# Patient Record
Sex: Male | Born: 1985 | Race: White | Hispanic: No | Marital: Married | Smoking: Current some day smoker
Health system: Southern US, Community
[De-identification: ages and names within clinical notes are randomized; demographics above are authoritative.]

## PROBLEM LIST (undated history)

## (undated) DIAGNOSIS — F32A Depression, unspecified: Secondary | ICD-10-CM

## (undated) DIAGNOSIS — F419 Anxiety disorder, unspecified: Secondary | ICD-10-CM

## (undated) DIAGNOSIS — T7840XA Allergy, unspecified, initial encounter: Secondary | ICD-10-CM

## (undated) DIAGNOSIS — M1990 Unspecified osteoarthritis, unspecified site: Secondary | ICD-10-CM

## (undated) HISTORY — DX: Anxiety disorder, unspecified: F41.9

## (undated) HISTORY — DX: Unspecified osteoarthritis, unspecified site: M19.90

## (undated) HISTORY — DX: Depression, unspecified: F32.A

## (undated) HISTORY — DX: Allergy, unspecified, initial encounter: T78.40XA

---

## 2009-04-08 ENCOUNTER — Emergency Department: Payer: Self-pay | Admitting: Emergency Medicine

## 2016-12-25 ENCOUNTER — Encounter: Payer: Self-pay | Admitting: *Deleted

## 2016-12-25 ENCOUNTER — Ambulatory Visit (INDEPENDENT_AMBULATORY_CARE_PROVIDER_SITE_OTHER): Payer: Self-pay

## 2016-12-25 ENCOUNTER — Ambulatory Visit
Admission: EM | Admit: 2016-12-25 | Discharge: 2016-12-25 | Disposition: A | Discharge status: Home / Self Care | Payer: Self-pay | Attending: Emergency Medicine | Admitting: Emergency Medicine

## 2016-12-25 DIAGNOSIS — J069 Acute upper respiratory infection, unspecified: Secondary | ICD-10-CM

## 2016-12-25 DIAGNOSIS — B9789 Other viral agents as the cause of diseases classified elsewhere: Secondary | ICD-10-CM

## 2016-12-25 DIAGNOSIS — R05 Cough: Secondary | ICD-10-CM

## 2016-12-25 MED ORDER — ALBUTEROL SULFATE HFA 108 (90 BASE) MCG/ACT IN AERS: 1.0000 | INHALATION_SPRAY | Freq: Four times a day (QID) | RESPIRATORY_TRACT | 0 refills | Status: DC | PRN

## 2016-12-25 MED ORDER — AEROCHAMBER PLUS MISC: 2 refills | Status: DC

## 2016-12-25 MED ORDER — IPRATROPIUM-ALBUTEROL 0.5-2.5 (3) MG/3ML IN SOLN
3.0000 mL | Freq: Once | RESPIRATORY_TRACT | Status: AC
  Administered 2016-12-25: 3 mL via RESPIRATORY_TRACT

## 2016-12-25 MED ORDER — BENZONATATE 200 MG PO CAPS: 200.0000 mg | ORAL_CAPSULE | Freq: Three times a day (TID) | ORAL | 0 refills | Status: DC | PRN

## 2016-12-25 MED ORDER — FLUTICASONE PROPIONATE 50 MCG/ACT NA SUSP: 2.0000 | Freq: Every day | NASAL | 0 refills | Status: DC

## 2016-12-25 MED ORDER — IBUPROFEN 600 MG PO TABS: 600.0000 mg | ORAL_TABLET | Freq: Four times a day (QID) | ORAL | 0 refills | Status: DC | PRN

## 2016-12-25 MED ORDER — GUAIFENESIN-CODEINE 100-10 MG/5ML PO SYRP: 10.0000 mL | ORAL_SOLUTION | Freq: Four times a day (QID) | ORAL | 0 refills | Status: DC | PRN

## 2016-12-25 NOTE — ED Provider Notes (Signed)
HPI  SUBJECTIVE:  Jay Hill is a 31 y.o. male who presents with nonproductive cough, chest congestion, wheezing, shortness of breath and diffuse constant chest tightness for the past 5 days. States that his chest is sore from all the coughing states he cannot sleep secondary to the cough.. States all this started off as a URI with nasal congestion, rhinorrhea, postnasal drip, sinus pain and pressure, sore throat for a day, which then resolved. He denies current sinus pain or pressure, nasal congestion, rhinorrhea. No body aches, headaches, ear pain. No fevers, posttussive emesis. He reports intermittent GERD like symptoms, but states that it does not seem to be associated with chest tightness or cough. He also reports allergy type symptoms with itchy eyes. He denies sneezing. He works outside and has a history of seasonal allergies. He also has a history of GERD, migraines. He tried TheraFlu, OTC sinus medicines, Mucinex, ibuprofen, humidifier and steam. The humidifier and steam seemed to help his symptoms. Symptoms are worse with lying down at night. He denies any antibiotics in the past month, no antipyretic in the past 6-8 hours. No chest tightness worse with exertion, diaphoresis, radiation to his neck, back, arm. No unintentional weight gain, lower extremity edema, dyspnea on exertion, nocturia, orthopnea, abdominal pain. He has no past medical history of lung disease, he smoked for 6 months as a teen, quit years ago. No history of hypertension, diabetes, MI, hypercholesterolemia, CHF. PMD: None.   History reviewed. No pertinent past medical history.  History reviewed. No pertinent surgical history.  History reviewed. No pertinent family history.  Social History  Substance Use Topics  . Smoking status: Former Games developer  . Smokeless tobacco: Never Used  . Alcohol use Yes    No current facility-administered medications for this encounter.   Current Outpatient Prescriptions:  .   albuterol (PROVENTIL HFA;VENTOLIN HFA) 108 (90 Base) MCG/ACT inhaler, Inhale 1-2 puffs into the lungs every 6 (six) hours as needed for wheezing or shortness of breath., Disp: 1 Inhaler, Rfl: 0 .  benzonatate (TESSALON) 200 MG capsule, Take 1 capsule (200 mg total) by mouth 3 (three) times daily as needed for cough., Disp: 30 capsule, Rfl: 0 .  fluticasone (FLONASE) 50 MCG/ACT nasal spray, Place 2 sprays into both nostrils daily., Disp: 16 g, Rfl: 0 .  guaiFENesin-codeine (CHERATUSSIN AC) 100-10 MG/5ML syrup, Take 10 mLs by mouth 4 (four) times daily as needed for cough., Disp: 120 mL, Rfl: 0 .  ibuprofen (ADVIL,MOTRIN) 600 MG tablet, Take 1 tablet (600 mg total) by mouth every 6 (six) hours as needed., Disp: 30 tablet, Rfl: 0 .  Spacer/Aero-Holding Chambers (AEROCHAMBER PLUS) inhaler, Use as instructed, Disp: 1 each, Rfl: 2  No Known Allergies   ROS  As noted in HPI.   Physical Exam  BP (!) 131/93 (BP Location: Left Arm)   Pulse 82   Temp 98.3 F (36.8 C) (Oral)   Resp 16   Ht 6\' 1"  (1.854 m)   Wt 210 lb (95.3 kg)   SpO2 98%   BMI 27.71 kg/m   Constitutional: Well developed, well nourished, no acute distress Eyes: PERRL, EOMI, conjunctiva normal bilaterally HENT: Normocephalic, atraumatic,mucus membranes moist. No sinus tenderness. Mild clear nasal congestion, normal oropharynx with cobblestoning, no postnasal drip. Respiratory: Occasional expiratory wheeze, positive diffuse chest wall tenderness no rales, rhonchi Cardiovascular: Normal rate and rhythm, no murmurs, no gallops, no rubs GI: Soft, nondistended, normal bowel sounds, nontender, no rebound, no guarding Back: no CVAT skin: No rash, skin intact  Musculoskeletal: Calves symmetric, nontender, No edema,, no deformities Neurologic: Alert & oriented x 3, CN II-XII grossly intact, no motor deficits, sensation grossly intact Psychiatric: Speech and behavior appropriate   ED Course   Medications  ipratropium-albuterol  (DUONEB) 0.5-2.5 (3) MG/3ML nebulizer solution 3 mL (3 mLs Nebulization Given 12/25/16 1123)    Orders Placed This Encounter  Procedures  . DG Chest 2 View    Standing Status:   Standing    Number of Occurrences:   1    Order Specific Question:   Reason for Exam (SYMPTOM  OR DIAGNOSIS REQUIRED)    Answer:   r/o PNA   No results found for this or any previous visit (from the past 24 hour(s)). Dg Chest 2 View  Result Date: 12/25/2016 CLINICAL DATA:  Nonproductive cough, fever, congestion EXAM: CHEST  2 VIEW COMPARISON:  None FINDINGS: Heart and mediastinal contours are within normal limits. No focal opacities or effusions. No acute bony abnormality. IMPRESSION: No active cardiopulmonary disease. Electronically Signed   By: Charlett Nose M.D.   On: 12/25/2016 12:01    ED Clinical Impression  Viral URI with cough   ED Assessment/Plan  Presentation most consistent with URI with cough. He may have some reactive airways worsening his cough. Doubt cardiac cause of his symptoms. Giving DuoNeb, we'll check a chest x-ray to rule out pneumonia. If negative, plan sent home with Claritin, Allegra Zyrtec D, Cheratussin, Tessalon, Flonase, ibuprofen 600 mg 1 g of Tylenol, and albuterol inhaler with a spacer 2 puffs every 4-6 hours as needed for coughing, wheezing, chest tightness and a primary care referral for him to follow up with in several days. He will return here or see his primary care physician if not better after 10 days of being sick and then we can reevaluate the need for antibiotics at that time.   imaging independently reviewed. No pneumonia. See radiology report for details  Reevaluation, patient states that he feels better. Lungs clear on exam. Good air movement.  Plan as above.  Discussed imaging, MDM, plan and followup with patient. Discussed sn/sx that should prompt return to the ED. Patient agrees with plan.   Meds ordered this encounter  Medications  . ipratropium-albuterol  (DUONEB) 0.5-2.5 (3) MG/3ML nebulizer solution 3 mL  . guaiFENesin-codeine (CHERATUSSIN AC) 100-10 MG/5ML syrup    Sig: Take 10 mLs by mouth 4 (four) times daily as needed for cough.    Dispense:  120 mL    Refill:  0  . benzonatate (TESSALON) 200 MG capsule    Sig: Take 1 capsule (200 mg total) by mouth 3 (three) times daily as needed for cough.    Dispense:  30 capsule    Refill:  0  . ibuprofen (ADVIL,MOTRIN) 600 MG tablet    Sig: Take 1 tablet (600 mg total) by mouth every 6 (six) hours as needed.    Dispense:  30 tablet    Refill:  0  . fluticasone (FLONASE) 50 MCG/ACT nasal spray    Sig: Place 2 sprays into both nostrils daily.    Dispense:  16 g    Refill:  0  . albuterol (PROVENTIL HFA;VENTOLIN HFA) 108 (90 Base) MCG/ACT inhaler    Sig: Inhale 1-2 puffs into the lungs every 6 (six) hours as needed for wheezing or shortness of breath.    Dispense:  1 Inhaler    Refill:  0  . Spacer/Aero-Holding Chambers (AEROCHAMBER PLUS) inhaler    Sig: Use as instructed  Dispense:  1 each    Refill:  2    *This clinic note was created using Scientist, clinical (histocompatibility and immunogenetics)Dragon dictation software. Therefore, there may be occasional mistakes despite careful proofreading.  ?   Domenick GongMortenson, Gem Conkle, MD 12/25/16 1221

## 2016-12-25 NOTE — Discharge Instructions (Signed)
Start Claritin d , Allegra  d or Zyrtec D,  Tessalon during the day. Cheratussin to help sleep at night., Flonase, ibuprofen 600 mg 1 g of Tylenol, and albuterol inhaler with a spacer 2 puffs every 4-6 hours as needed for coughing, wheezing, chest tightness. primary care referral for him to follow up with in several days. return here or see his primary care physician if not better after 10 days of being sick and then we can reevaluate the need for antibiotics at that time.    Here is a list of primary care providers who are taking new patients:  Dr. Elizabeth Sauereanna Jones, Dr. Schuyler AmorWilliam Plonk [address] [place] [id number] [phone]  Dr. Everlene OtherJayce Cook [address]  [address]  [place] [id number]  [phone]  [facility] [place] [address] [place] [address]  [place] [address]  [phone]  [facility] [address]  [place], [place] [phone]  [facility] [place] [address]  [phone] [place] [address]

## 2016-12-25 NOTE — ED Triage Notes (Signed)
Fever and sore throat a week ago which resolved but then non-productive cough, chest soreness, runny nose and fatigue.

## 2018-08-05 IMAGING — CR DG CHEST 2V
2 series · 3 of 3 positions shown · non-contrast
Comparison: None

CLINICAL DATA: Nonproductive cough, fever, congestion

EXAM:
CHEST  2 VIEW

[Series 1: chest pa · 0.14mm/px · 2 of 2 slices shown]
[im 1/2]
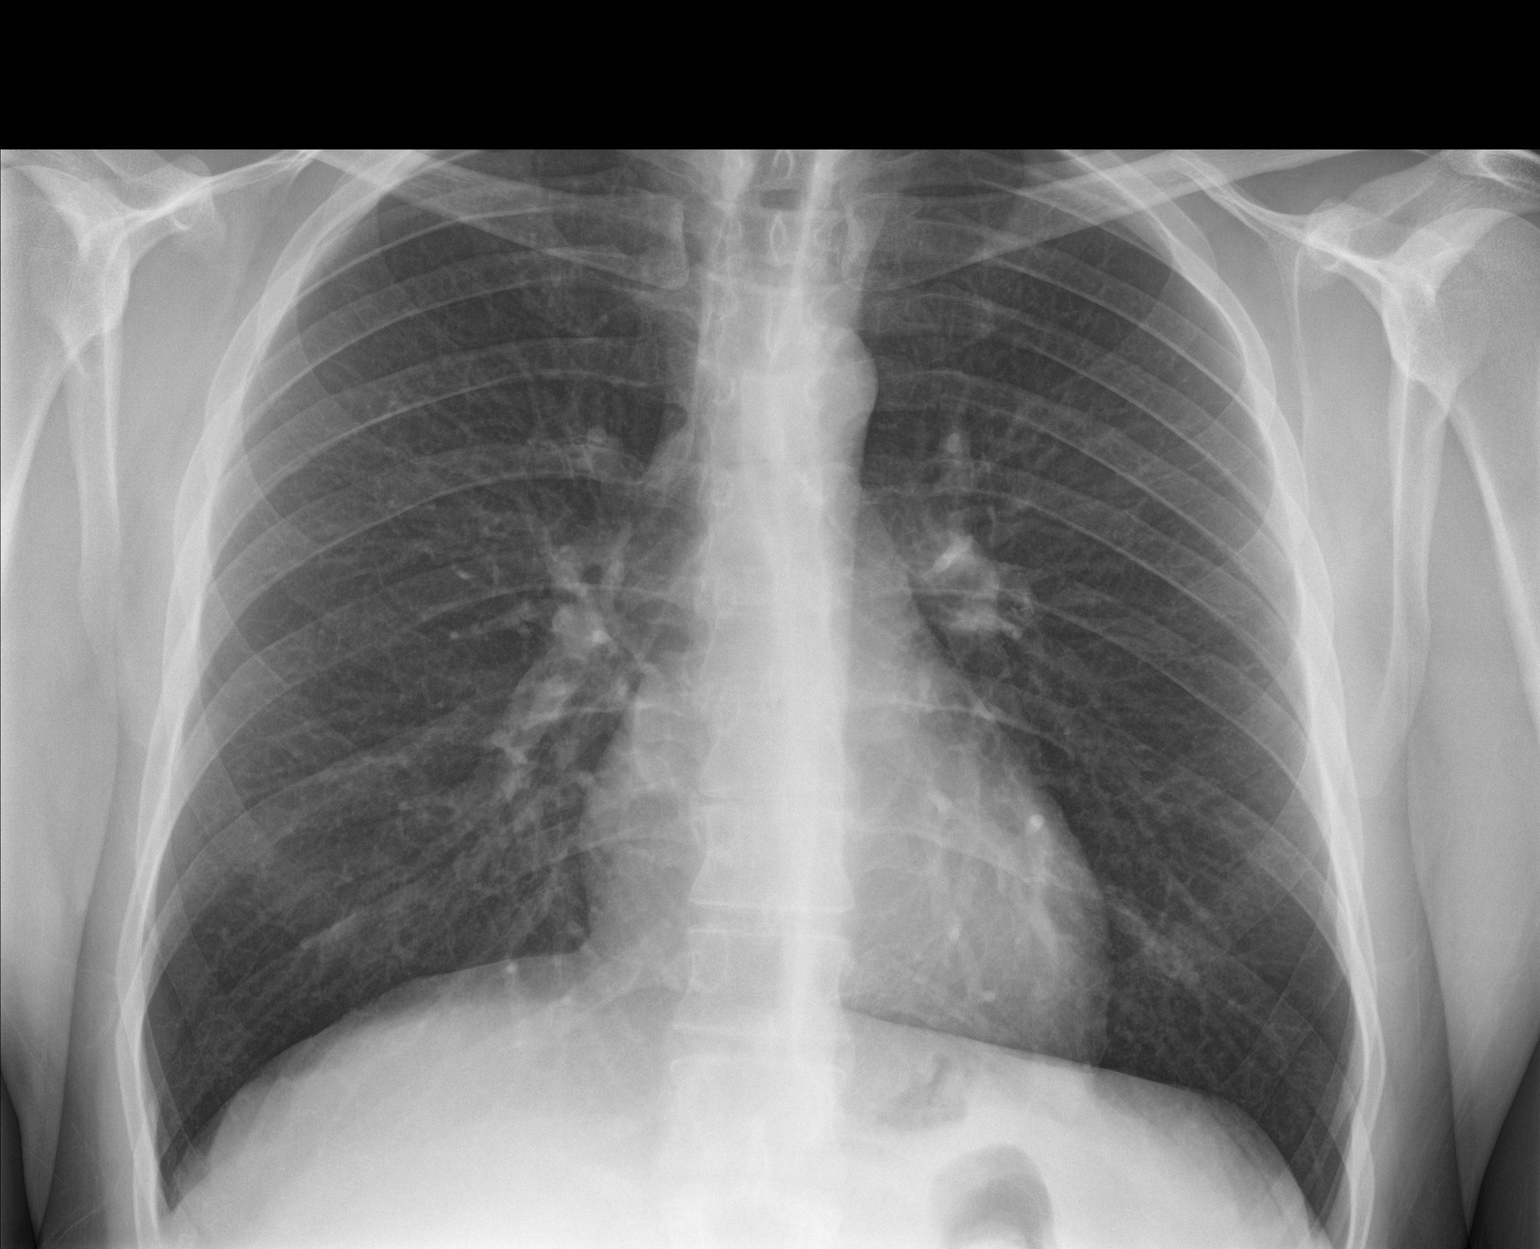
[im 2/2]
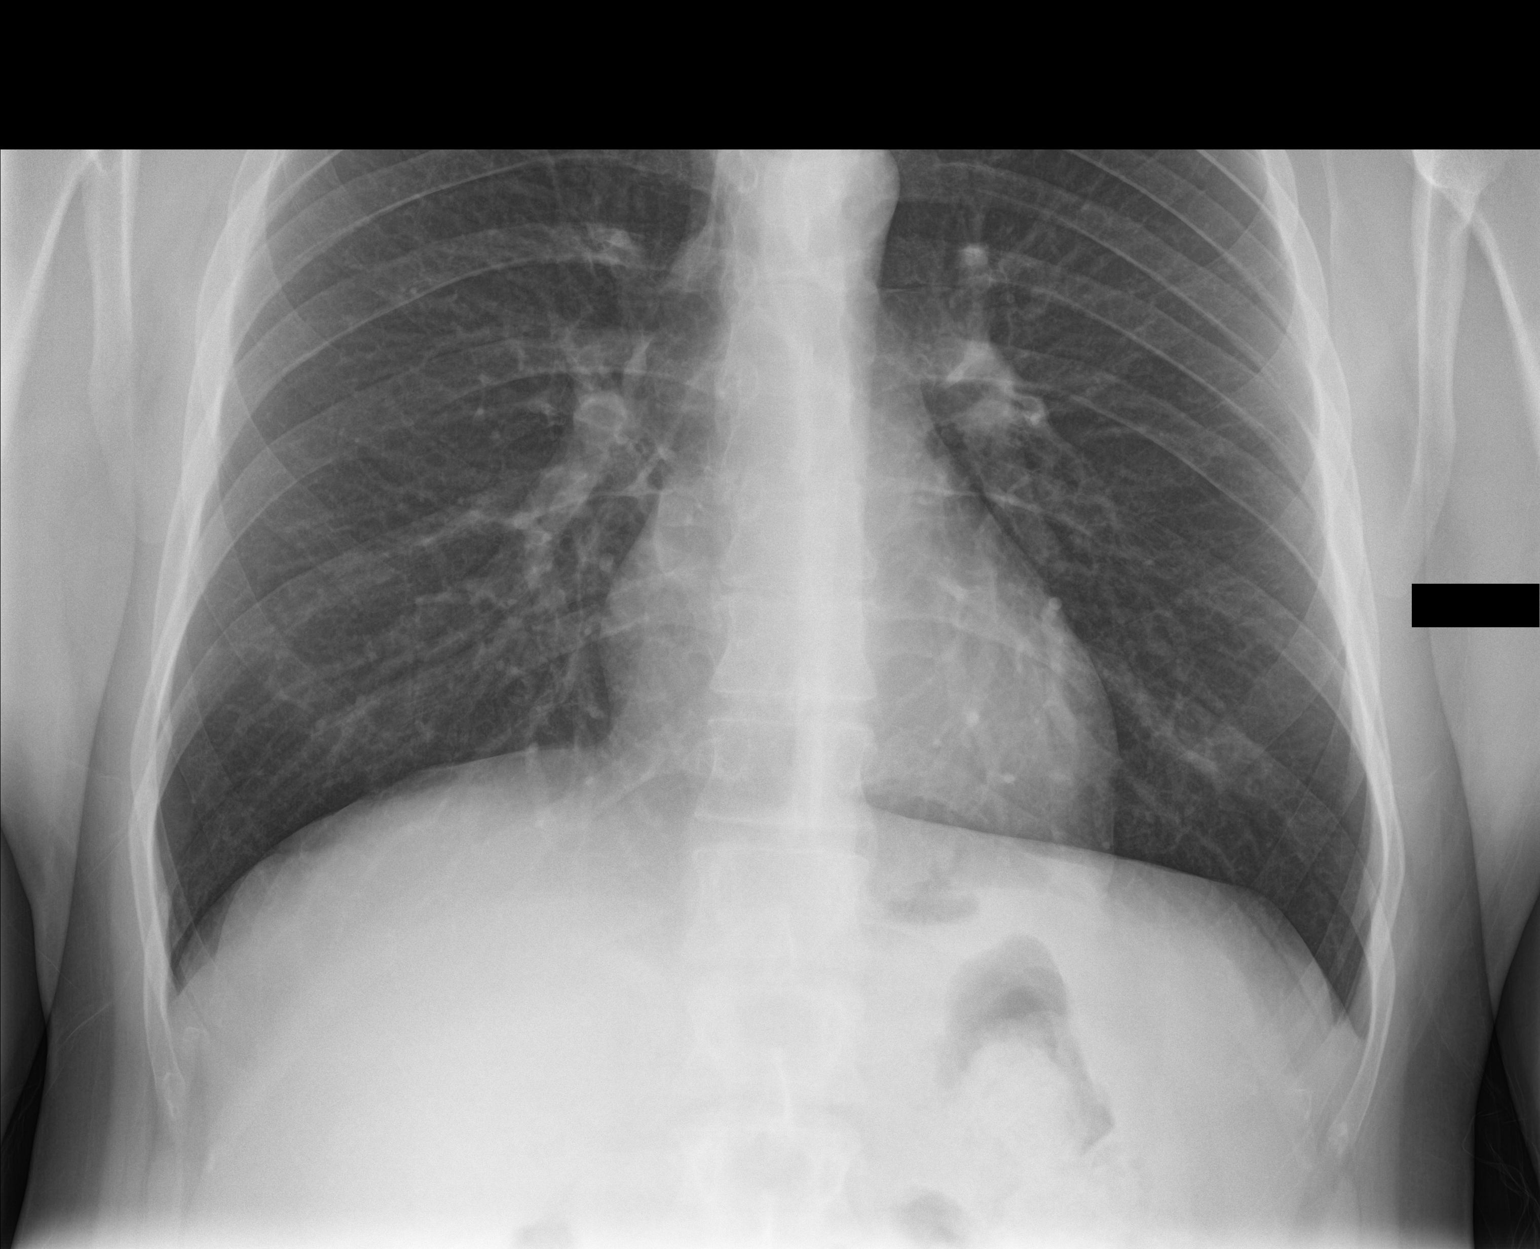

[chest lat]
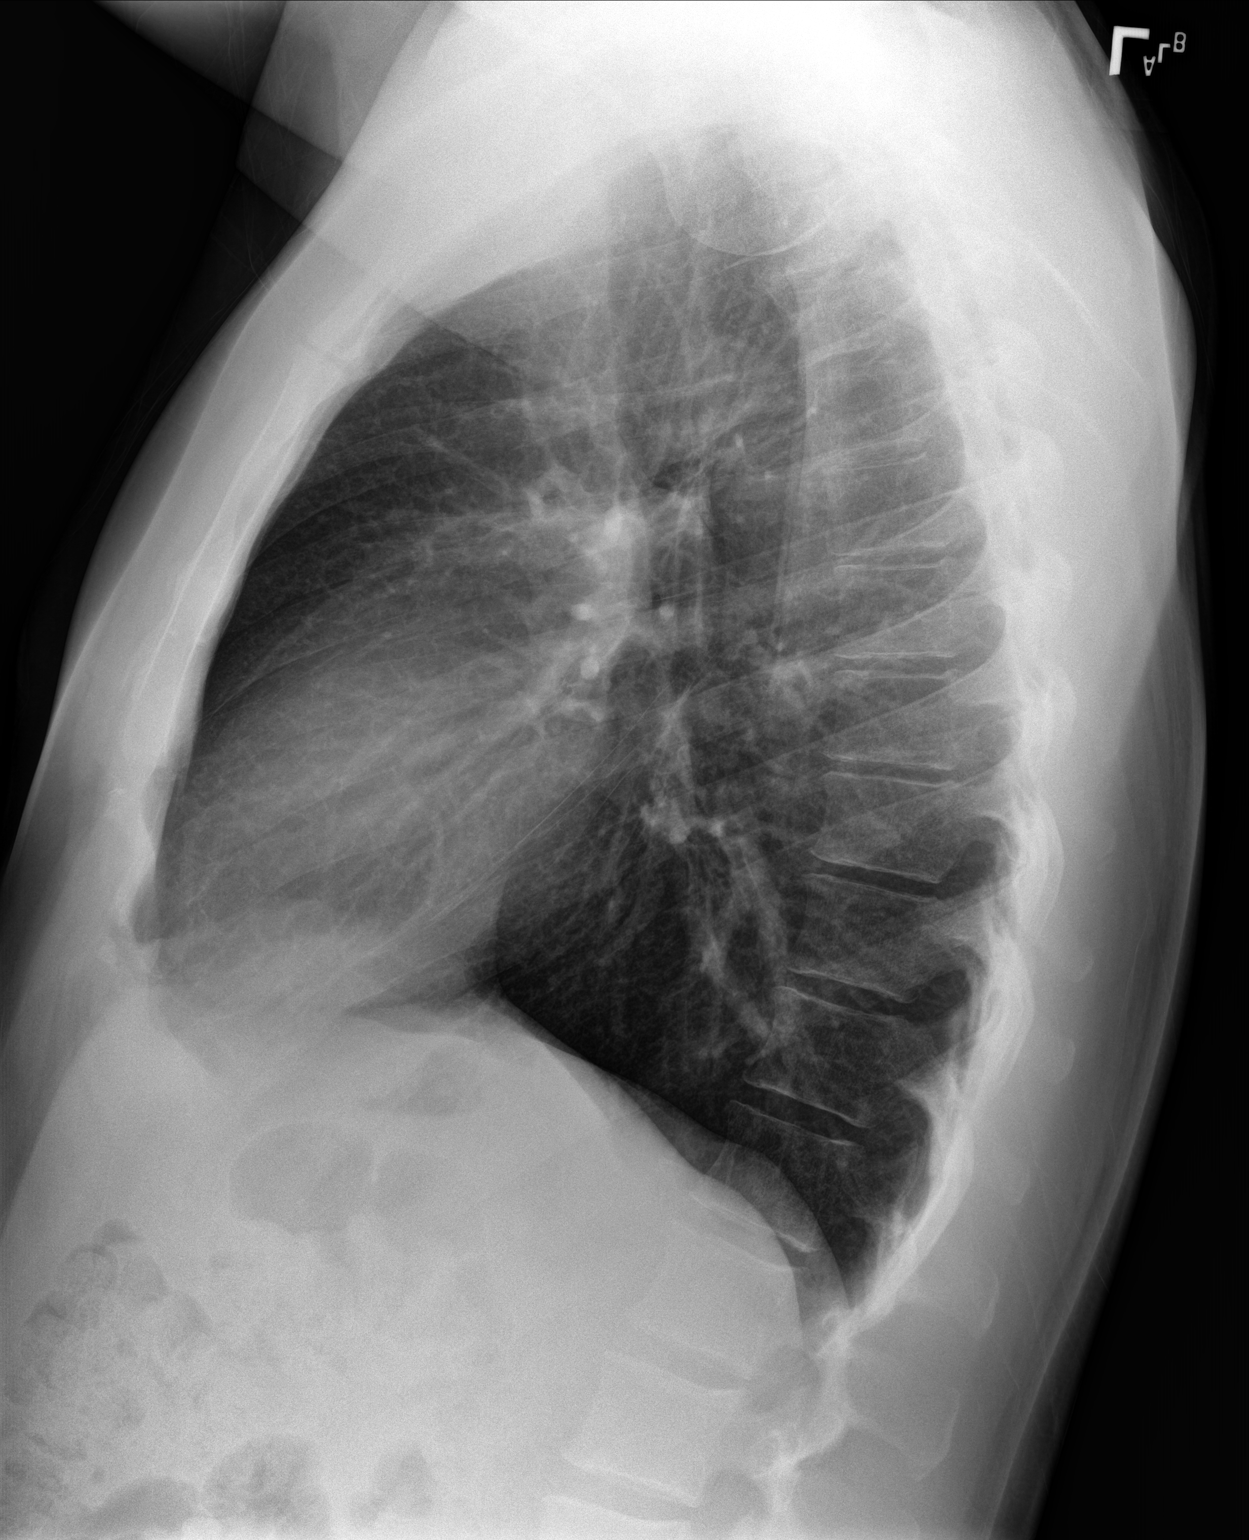

[3 of 3 positions shown; findings below may reference images not displayed]

FINDINGS: Heart and mediastinal contours are within normal limits. No focal
opacities or effusions. No acute bony abnormality.
IMPRESSION: No active cardiopulmonary disease.

## 2020-06-22 ENCOUNTER — Ambulatory Visit (LOCAL_COMMUNITY_HEALTH_CENTER): Payer: Self-pay

## 2020-06-22 ENCOUNTER — Other Ambulatory Visit: Payer: Self-pay

## 2020-06-22 DIAGNOSIS — Z23 Encounter for immunization: Secondary | ICD-10-CM

## 2020-06-22 NOTE — Progress Notes (Signed)
Tolerated Tdap well. Updated [facility] copy given and recommended schedule explained. Jerel Shepherd, RN

## 2020-11-08 ENCOUNTER — Ambulatory Visit: Payer: Self-pay | Admitting: Nurse Practitioner

## 2020-11-15 ENCOUNTER — Other Ambulatory Visit: Payer: Self-pay

## 2020-11-15 ENCOUNTER — Ambulatory Visit (INDEPENDENT_AMBULATORY_CARE_PROVIDER_SITE_OTHER): Payer: No Typology Code available for payment source | Admitting: Nurse Practitioner

## 2020-11-15 ENCOUNTER — Encounter: Payer: Self-pay | Admitting: Nurse Practitioner

## 2020-11-15 VITALS — BP 136/81 | HR 81 | Ht 73.7 in | Wt 214.4 lb

## 2020-11-15 DIAGNOSIS — G8929 Other chronic pain: Secondary | ICD-10-CM

## 2020-11-15 DIAGNOSIS — Z0000 Encounter for general adult medical examination without abnormal findings: Secondary | ICD-10-CM | POA: Diagnosis not present

## 2020-11-15 DIAGNOSIS — Z114 Encounter for screening for human immunodeficiency virus [HIV]: Secondary | ICD-10-CM

## 2020-11-15 DIAGNOSIS — M25511 Pain in right shoulder: Secondary | ICD-10-CM

## 2020-11-15 DIAGNOSIS — Z7689 Persons encountering health services in other specified circumstances: Secondary | ICD-10-CM

## 2020-11-15 DIAGNOSIS — F339 Major depressive disorder, recurrent, unspecified: Secondary | ICD-10-CM | POA: Diagnosis not present

## 2020-11-15 DIAGNOSIS — R42 Dizziness and giddiness: Secondary | ICD-10-CM

## 2020-11-15 DIAGNOSIS — Z1159 Encounter for screening for other viral diseases: Secondary | ICD-10-CM

## 2020-11-15 DIAGNOSIS — Z720 Tobacco use: Secondary | ICD-10-CM

## 2020-11-15 DIAGNOSIS — F17220 Nicotine dependence, chewing tobacco, uncomplicated: Secondary | ICD-10-CM | POA: Insufficient documentation

## 2020-11-15 DIAGNOSIS — Z87891 Personal history of nicotine dependence: Secondary | ICD-10-CM | POA: Insufficient documentation

## 2020-11-15 NOTE — Assessment & Plan Note (Addendum)
Chronic, stable. Continue ibuprofen or tylenol as needed for pain. Going to chiropractor next week. Can try voltaren gel. Alternate ice and heat. Follow-up if symptoms worsen.

## 2020-11-15 NOTE — Assessment & Plan Note (Signed)
Has a history of depression and anxiety. PHQ9 today is a 10. He says that he has an appointment with psychiatry next week. Denies suicidal ideation. Will collaborate with psychiatry for treatment plan.

## 2020-11-15 NOTE — Assessment & Plan Note (Signed)
   Discussed smoking cessation. 

## 2020-11-15 NOTE — Assessment & Plan Note (Signed)
Intermittent vertigo that occurs 1-2 times a month with sudden movements. Resolves after a few minutes while laying flat. Epley maneuvers given. Follow-up if symptoms worsen.

## 2020-11-15 NOTE — Patient Instructions (Signed)
It was great to see you!  We are checking your bloodwork today and should have the results on mychart tomorrow. Your dizziness sounds like vertigo. I have put some exercises that you can try when it happens to help your dizziness. If it worsens or happens more frequently, call the office. You can try voltaren gel on your shoulder to help with the pain.   Take care,  Rodman PickleLauren Priest Lockridge, NP   Health Maintenance, Male Adopting a healthy lifestyle and getting preventive care are important in promoting health and wellness. Ask your health care provider about:  The right schedule for you to have regular tests and exams.  Things you can do on your own to prevent diseases and keep yourself healthy. What should I know about diet, weight, and exercise? Eat a healthy diet  Eat a diet that includes plenty of vegetables, fruits, low-fat dairy products, and lean protein.  Do not eat a lot of foods that are high in solid fats, added sugars, or sodium.   Maintain a healthy weight Body mass index (BMI) is a measurement that can be used to identify possible weight problems. It estimates body fat based on height and weight. Your health care provider can help determine your BMI and help you achieve or maintain a healthy weight. Get regular exercise Get regular exercise. This is one of the most important things you can do for your health. Most adults should:  Exercise for at least 150 minutes each week. The exercise should increase your heart rate and make you sweat (moderate-intensity exercise).  Do strengthening exercises at least twice a week. This is in addition to the moderate-intensity exercise.  Spend less time sitting. Even light physical activity can be beneficial. Watch cholesterol and blood lipids Have your blood tested for lipids and cholesterol at 36 years of age, then have this test every 5 years. You may need to have your cholesterol levels checked more often if:  Your lipid or cholesterol  levels are high.  You are older than 36 years of age.  You are at high risk for heart disease. What should I know about cancer screening? Many types of cancers can be detected early and may often be prevented. Depending on your health history and family history, you may need to have cancer screening at various ages. This may include screening for:  Colorectal cancer.  Prostate cancer.  Skin cancer.  Lung cancer. What should I know about heart disease, diabetes, and high blood pressure? Blood pressure and heart disease  High blood pressure causes heart disease and increases the risk of stroke. This is more likely to develop in people who have high blood pressure readings, are of African descent, or are overweight.  Talk with your health care provider about your target blood pressure readings.  Have your blood pressure checked: ? Every 3-5 years if you are 5618-36 years of age. ? Every year if you are 411 years old or older.  If you are between the ages of 3665 and 6175 and are a current or former smoker, ask your health care provider if you should have a one-time screening for abdominal aortic aneurysm (AAA). Diabetes Have regular diabetes screenings. This checks your fasting blood sugar level. Have the screening done:  Once every three years after age 36 if you are at a normal weight and have a low risk for diabetes.  More often and at a younger age if you are overweight or have a high risk for diabetes. What  should I know about preventing infection? Hepatitis B If you have a higher risk for hepatitis B, you should be screened for this virus. Talk with your health care provider to find out if you are at risk for hepatitis B infection. Hepatitis C Blood testing is recommended for:  Everyone born from [birth year] through 1966.  Anyone with known risk factors for hepatitis C. Sexually transmitted infections (STIs)  You should be screened each year for STIs, including gonorrhea and  chlamydia, if: ? You are sexually active and are younger than 36 years of age. ? You are older than 36 years of age and your health care provider tells you that you are at risk for this type of infection. ? Your sexual activity has changed since you were last screened, and you are at increased risk for chlamydia or gonorrhea. Ask your health care provider if you are at risk.  Ask your health care provider about whether you are at high risk for HIV. Your health care provider may recommend a prescription medicine to help prevent HIV infection. If you choose to take medicine to prevent HIV, you should first get tested for HIV. You should then be tested every 3 months for as long as you are taking the medicine. Follow these instructions at home: Lifestyle  Do not use any products that contain nicotine or tobacco, such as cigarettes, e-cigarettes, and chewing tobacco. If you need help quitting, ask your health care provider.  Do not use street drugs.  Do not share needles.  Ask your health care provider for help if you need support or information about quitting drugs. Alcohol use  Do not drink alcohol if your health care provider tells you not to drink.  If you drink alcohol: ? Limit how much you have to 0-2 drinks a day. ? Be aware of how much alcohol is in your drink. In the [place] one drink equals one 12 oz bottle of beer (355 mL), one 5 oz glass of wine (148 mL), or one 1 oz glass of hard liquor (44 mL). General instructions  Schedule regular health, dental, and eye exams.  Stay current with your vaccines.  Tell your health care provider if: ? You often feel depressed. ? You have ever been abused or do not feel safe at home. Summary  Adopting a healthy lifestyle and getting preventive care are important in promoting health and wellness.  Follow your health care provider's instructions about healthy diet, exercising, and getting tested or screened for diseases.  Follow your health  care provider's instructions on monitoring your cholesterol and blood pressure. This information is not intended to replace advice given to you by your health care provider. Make sure you discuss any questions you have with your health care provider. Document Revised: 07/28/2018 Document Reviewed: 07/28/2018 Elsevier Patient Education  2021 Elsevier Inc.   How to Perform the Epley Maneuver The Epley maneuver is an exercise that relieves symptoms of vertigo. Vertigo is the feeling that you or your surroundings are moving when they are not. When you feel vertigo, you may feel like the room is spinning and may have trouble walking. The Epley maneuver is used for a type of vertigo caused by a calcium deposit in a part of the inner ear. The maneuver involves changing head positions to help the deposit move out of the area. You can do this maneuver at home whenever you have symptoms of vertigo. You can repeat it in 24 hours if your vertigo has not  gone away. Even though the Epley maneuver may relieve your vertigo for a few weeks, it is possible that your symptoms will return. This maneuver relieves vertigo, but it does not relieve dizziness. What are the risks? If it is done correctly, the Epley maneuver is considered safe. Sometimes it can lead to dizziness or nausea that goes away after a short time. If you develop other symptoms--such as changes in vision, weakness, or numbness--stop doing the maneuver and call your health care provider. Supplies needed:  A bed or table.  A pillow. How to do the Epley maneuver 1. Sit on the edge of a bed or table with your back straight and your legs extended or hanging over the edge of the bed or table. 2. Turn your head halfway toward the affected ear or side as told by your health care provider. 3. Lie backward quickly with your head turned until you are lying flat on your back. You may want to position a pillow under your shoulders. 4. Hold this position for  at least 30 seconds. If you feel dizzy or have symptoms of vertigo, continue to hold the position until the symptoms stop. 5. Turn your head to the opposite direction until your unaffected ear is facing the floor. 6. Hold this position for at least 30 seconds. If you feel dizzy or have symptoms of vertigo, continue to hold the position until the symptoms stop. 7. Turn your whole body to the same side as your head so that you are positioned on your side. Your head will now be nearly facedown. Hold for at least 30 seconds. If you feel dizzy or have symptoms of vertigo, continue to hold the position until the symptoms stop. 8. Sit back up. You can repeat the maneuver in 24 hours if your vertigo does not go away.      Follow these instructions at home: For 24 hours after doing the Epley maneuver:  Keep your head in an upright position.  When lying down to sleep or rest, keep your head raised (elevated) with two or more pillows.  Avoid excessive neck movements. Activity  Do not drive or use machinery if you feel dizzy.  After doing the Epley maneuver, return to your normal activities as told by your health care provider. Ask your health care provider what activities are safe for you. General instructions  Drink enough fluid to keep your urine pale yellow.  Do not drink alcohol.  Take over-the-counter and prescription medicines only as told by your health care provider.  Keep all follow-up visits as told by your health care provider. This is important. Preventing vertigo symptoms Ask your health care provider if there is anything you should do at home to prevent vertigo. He or she may recommend that you:  Keep your head elevated with two or more pillows while you sleep.  Do not sleep on the side of your affected ear.  Get up slowly from bed.  Avoid sudden movements during the day.  Avoid extreme head positions or movement, such as looking up or bending over. Contact a health care  provider if:  Your vertigo gets worse.  You have other symptoms, including: ? Nausea. ? Vomiting. ? Headache. Get help right away if you:  Have vision changes.  Have a headache or neck pain that is severe or getting worse.  Cannot stop vomiting.  Have new numbness or weakness in any part of your body. Summary  Vertigo is the feeling that you or your  surroundings are moving when they are not.  The Epley maneuver is an exercise that relieves symptoms of vertigo.  If the Epley maneuver is done correctly, it is considered safe and relieves vertigo quickly. This information is not intended to replace advice given to you by your health care provider. Make sure you discuss any questions you have with your health care provider. Document Revised: 06/01/2019 Document Reviewed: 06/01/2019 Elsevier Patient Education  2021 [employer].
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
## 2020-11-16 LAB — CBC WITH DIFFERENTIAL/PLATELET
Basophils Absolute: 0.1 10*3/uL (ref 0.0–0.2)
Basos: 1 %
EOS (ABSOLUTE): 0.4 10*3/uL (ref 0.0–0.4)
Eos: 4 %
Hematocrit: 46 % (ref 37.5–51.0)
Hemoglobin: 15.6 g/dL (ref 13.0–17.7)
Immature Grans (Abs): 0.1 10*3/uL (ref 0.0–0.1)
Immature Granulocytes: 1 %
Lymphocytes Absolute: 2 10*3/uL (ref 0.7–3.1)
Lymphs: 23 %
MCH: 30.6 pg (ref 26.6–33.0)
MCHC: 33.9 g/dL (ref 31.5–35.7)
MCV: 90 fL (ref 79–97)
Monocytes Absolute: 1 10*3/uL — ABNORMAL HIGH (ref 0.1–0.9)
Monocytes: 11 %
Neutrophils Absolute: 5.3 10*3/uL (ref 1.4–7.0)
Neutrophils: 60 %
Platelets: 295 10*3/uL (ref 150–450)
RBC: 5.1 x10E6/uL (ref 4.14–5.80)
RDW: 12 % (ref 11.6–15.4)
WBC: 8.8 10*3/uL (ref 3.4–10.8)

## 2020-11-16 LAB — COMPREHENSIVE METABOLIC PANEL
ALT: 25 IU/L (ref 0–44)
AST: 18 IU/L (ref 0–40)
Albumin/Globulin Ratio: 2.7 — ABNORMAL HIGH (ref 1.2–2.2)
Albumin: 5.3 g/dL — ABNORMAL HIGH (ref 4.0–5.0)
Alkaline Phosphatase: 54 IU/L (ref 44–121)
BUN/Creatinine Ratio: 20 (ref 9–20)
BUN: 19 mg/dL (ref 6–20)
Bilirubin Total: 0.3 mg/dL (ref 0.0–1.2)
CO2: 21 mmol/L (ref 20–29)
Calcium: 9.9 mg/dL (ref 8.7–10.2)
Chloride: 98 mmol/L (ref 96–106)
Creatinine, Ser: 0.94 mg/dL (ref 0.76–1.27)
Globulin, Total: 2 g/dL (ref 1.5–4.5)
Glucose: 78 mg/dL (ref 65–99)
Potassium: 3.8 mmol/L (ref 3.5–5.2)
Sodium: 137 mmol/L (ref 134–144)
Total Protein: 7.3 g/dL (ref 6.0–8.5)
eGFR: 108 mL/min/{1.73_m2} (ref >59)

## 2020-11-16 LAB — HEPATITIS C ANTIBODY: Hep C Virus Ab: <0.1 s/co ratio (ref 0.0–0.9)

## 2020-11-16 LAB — LIPID PANEL W/O CHOL/HDL RATIO
Cholesterol, Total: 210 mg/dL — ABNORMAL HIGH (ref 100–199)
HDL: 40 mg/dL (ref >39)
LDL Chol Calc (NIH): 119 mg/dL — ABNORMAL HIGH (ref 0–99)
Triglycerides: 291 mg/dL — ABNORMAL HIGH (ref 0–149)
VLDL Cholesterol Cal: 51 mg/dL — ABNORMAL HIGH (ref 5–40)

## 2020-11-16 LAB — TSH: TSH: 0.774 u[IU]/mL (ref 0.450–4.500)

## 2020-11-16 LAB — HIV ANTIBODY (ROUTINE TESTING W REFLEX): HIV Screen 4th Generation wRfx: NONREACTIVE

## 2020-12-12 ENCOUNTER — Encounter: Payer: Self-pay | Admitting: Registered Nurse

## 2020-12-12 ENCOUNTER — Telehealth: Payer: Self-pay | Admitting: Registered Nurse

## 2020-12-12 NOTE — Telephone Encounter (Signed)
Patient contacted via telephone and reported his stomach cramps started upon awakening.  Typically cannot exert as symptoms worsen so called in sick today.  Rested and feeling better denied n/v/d/fever/chills/body aches/cough/rhinitis.  Took home covid test as precaution negative.  Denied known sick contacts.  This is a recurrent problem for him--personal medical typically resolves with 24 hours rest.  Discussed with patient there is flu, covid and viral gastroenteritis circulating in community also if n/v/d start overnight to follow normal call out procedures in am.  If vomiting I have recommended clear fluids x 24 hours then bland diet.  Avoid dairy/spicy, fried and large portions of meat while having nausea.  If vomiting hold po intake x 1 hour.  Then sips clear fluids like broths, ginger ale, power ade, gatorade, pedialyte may advance to soft/bland if no vomiting x 24 hours and appetite returned otherwise hydration main focus. Notify clinic if symptoms persist or worsen; I have alerted the patient to call if high fever, dehydration, marked weakness, fainting, increased abdominal pain, blood in stool or vomit (red or black).   Patient notified cleared to return onsite 12/13/2020 as long as no new or worsening symptoms.  Follow normal call out procedures if abdomen pain again tomorrow and would require to see [facility] or Urgent Care if 3 days of work missed per normal sick policy for [employer].   Patient verbalized agreement and understanding of treatment plan and had no further questions at this time. HR notified personal medical; negative covid test cleared to return onsite 12/13/2020 as long as no new or worsening symptoms.

## 2020-12-13 NOTE — Telephone Encounter (Signed)
Patient came to clinic stating at work no questions or concerns.

## 2020-12-13 NOTE — Telephone Encounter (Signed)
Called pt to check if he RTW today or if sx changed or worsened. No answer. LVMRCB.

## 2021-03-15 ENCOUNTER — Telehealth: Payer: No Typology Code available for payment source

## 2021-03-15 NOTE — Telephone Encounter (Signed)
Copied from [place] [id number]. Topic: Appointment Scheduling - Scheduling Inquiry for Clinic >> Mar 15, 2021  8:50 AM Scherrie Gerlach wrote: Reason for CRM: pt states it was not a good fit for him when he met Lauren.  Wife sees [place] and he would like to switch to [place] as well.  Is that OK? He needs to be seen for depression

## 2021-03-18 NOTE — Telephone Encounter (Signed)
Patients wife calling about patient switching do Dr Harvest Dark from Dr Chauncy Lean. Please callback

## 2021-03-18 NOTE — Telephone Encounter (Signed)
Would this be ok for the patient to do?

## 2021-03-20 ENCOUNTER — Encounter: Payer: Self-pay | Admitting: Registered Nurse

## 2021-03-20 ENCOUNTER — Telehealth: Payer: Self-pay | Admitting: Registered Nurse

## 2021-03-20 DIAGNOSIS — Z20822 Contact with and (suspected) exposure to covid-19: Secondary | ICD-10-CM

## 2021-03-20 NOTE — Telephone Encounter (Signed)
HR notified me patient sent home after notification spouse tested positive for covid today. Patient attempting to get PCR test on the way home today.  Left message for patient to contact me at [phone]
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
## 2021-03-22 NOTE — Telephone Encounter (Signed)
Spoke with pt by phone. He confirms positive pcr result. Sts he remains asymptomatic. Dates remain unchanged from test date. Quarantine thru 8/8. RTW 8/9 with strict mask use thru 8/13. He denies any questions or concerns.

## 2021-03-22 NOTE — Telephone Encounter (Signed)
Reviewed RN Haley note agreed with plan of care. 

## 2021-03-22 NOTE — Telephone Encounter (Signed)
Notified by patient supervisor he reported positive test PCR results.  Patient to stay home and quarantine x 5 days through 8/8 RTW 8/9 RN Rolly Salter to follow up with patient today via telephone.

## 2021-03-25 NOTE — Telephone Encounter (Signed)
Spoke with pt by phone. Still asymptomatic. Cleared to RTW tomorrow 8/9 with strict mask use thru 8/13. Denies questions or concerns.

## 2021-03-25 NOTE — Telephone Encounter (Signed)
Reviewed RN Haley note agreed with plan of care. 

## 2021-03-26 NOTE — Telephone Encounter (Signed)
Confirmed with pt he did RTW today 8/9 as expected. Strict mask use thru 8/13.

## 2021-03-26 NOTE — Telephone Encounter (Signed)
Called and LVM letting patient's wife know Jolene's response. PCP updated in patient's chart.

## 2021-03-26 NOTE — Telephone Encounter (Signed)
Reviewed RN Rolly Salter note patient returned onsite with strict mask use through day 10 03/30/21 and no eating in employee lunchroom.

## 2021-04-03 NOTE — Telephone Encounter (Signed)
Patient contacted via telephone stated feeling well no concerns or questions.  Didn't see missed call/voicemail from me as phone alert hasn't been working.  A&Ox3 spoke full sentences without difficulty no cough/congestion; 1 episode throat clearing audible during 2 minute call.  Encounter closed.

## 2021-04-13 ENCOUNTER — Encounter: Payer: Self-pay | Admitting: Nurse Practitioner

## 2021-04-13 DIAGNOSIS — E7800 Pure hypercholesterolemia, unspecified: Secondary | ICD-10-CM | POA: Insufficient documentation

## 2021-04-17 ENCOUNTER — Ambulatory Visit (INDEPENDENT_AMBULATORY_CARE_PROVIDER_SITE_OTHER): Payer: No Typology Code available for payment source | Admitting: Nurse Practitioner

## 2021-04-17 ENCOUNTER — Encounter: Payer: Self-pay | Admitting: Nurse Practitioner

## 2021-04-17 DIAGNOSIS — F419 Anxiety disorder, unspecified: Secondary | ICD-10-CM | POA: Diagnosis not present

## 2021-04-17 DIAGNOSIS — F339 Major depressive disorder, recurrent, unspecified: Secondary | ICD-10-CM | POA: Diagnosis not present

## 2021-04-17 MED ORDER — CITALOPRAM HYDROBROMIDE 10 MG PO TABS
10.0000 mg | ORAL_TABLET | Freq: Every day | ORAL | 3 refills | Status: DC
Start: 2021-04-17 — End: 2021-08-31

## 2021-04-17 NOTE — Assessment & Plan Note (Signed)
Refer to depression plan of care.  Suspect much of focus issue comes we anxiety, however if ongoing focus issues could consider referral to psychiatry for ADD/ADHD testing.

## 2021-04-17 NOTE — Patient Instructions (Signed)
Suicidal Feelings: How to Help Yourself Suicide is when you end your own life. There are many things you can do to help yourself feel better when struggling with these feelings. Many services and people are available to support you and others who struggle with similar feelings.  If you ever feel like you may hurt yourself or others, or have thoughts about taking your own life, get help right away. To get help: Call your local emergency services (911 in the [place] The [employer]'s health and human services helpline (211 in the [place] Go to your nearest emergency department. Call a suicide hotline to speak with a trained counselor. The following suicide hotlines are available in the [place]: 1-800-273-TALK (1-800-273-8255). 1-800-SUICIDE (1-800-784-2433). 1-888-628-9454. This is a hotline for Spanish speakers. 1-800-799-4889. This is a hotline for TTY users. 1-866-4-U-TREVOR (1-866-488-7386). This is a hotline for lesbian, gay, bisexual, transgender, or questioning youth. For a list of hotlines in [place], visit www.suicide.org/hotlines/international/[place]-suicide-hotlines.html Contact a crisis center or a local suicide prevention center. To find a crisis center or suicide prevention center: Call your local hospital, clinic, community service organization, mental health center, social service provider, or health department. Ask for help with connecting to a crisis center. For a list of crisis centers in the [place], visit: suicidepreventionlifeline.org For a list of crisis centers in [place], visit: suicideprevention.ca How to help yourself feel better  Promise yourself that you will not do anything extreme when you have suicidal feelings. Remember the times you have felt hopeful. Many people have gotten through suicidal thoughts and feelings, and you can too. If you have had these feelings before, remind yourself that you can get through them again. Let family, friends, teachers, or  counselors know how you are feeling. Try not to separate yourself from those who care about you and want to help you. Talk with someone every day, even if you do not feel sociable. Face-to-face conversation is best to help them understand your feelings. Contact a mental health care provider and work with this person regularly. Make a safety plan that you can follow during a crisis. Include phone numbers of suicide prevention hotlines, mental health professionals, and trusted friends and family members you can call during an emergency. Save these numbers on your phone. If you are thinking of taking a lot of medicine, give your medicine to someone who can give it to you as prescribed. If you are on antidepressants and are concerned you will overdose, tell your health care provider so that he or she can give you safer medicines. Try to stick to your routines and follow a schedule every day. Make self-care a priority. Make a list of realistic goals, and cross them off when you achieve them. Accomplishments can give you a sense of worth. Wait until you are feeling better before doing things that you find difficult or unpleasant. Do things that you have always enjoyed to take your mind off your feelings. Try reading a book, or listening to or playing music. Spending time outside, in nature, may help you feel better. Follow these instructions at home:  Visit your primary health care provider every year for a checkup. Work with a mental health care provider as needed. Eat a well-balanced diet, and eat regular meals. Get plenty of rest. Exercise if you are able. Just 30 minutes of exercise each day can help you feel better. Take over-the-counter and prescription medicines only as told by your health care provider. Ask your mental health care provider about   the possible side effects of any medicines you are taking. Do not use alcohol or drugs, and remove these substances from your home. Remove weapons,  poisons, knives, and other deadly items from your home. General recommendations Keep your living space well lit. When you are feeling well, write yourself a letter with tips and support that you can read when you are not feeling well. Remember that life's difficulties can be sorted out with help. Conditions can be treated, and you can learn behaviors and ways of thinking that will help you. Where to find more information National Suicide Prevention Lifeline: www.suicidepreventionlifeline.org Hopeline: www.hopeline.com American Foundation for Suicide Prevention: www.afsp.org The Trevor Project (for lesbian, gay, bisexual, transgender, or questioning youth): www.thetrevorproject.org National Institute of Mental Health: https://www.nimh.nih.gov/health/topics/suicide-prevention Contact a health care provider if: You feel as though you are a burden to others. You feel agitated, angry, vengeful, or have extreme mood swings. You have withdrawn from family and friends. Get help right away if: You are talking about suicide or wishing to die. You start making plans for how to commit suicide. You feel that you have no reason to live. You start making plans for putting your affairs in order, saying goodbye, or giving your possessions away. You feel guilt, shame, or unbearable pain, and it seems like there is no way out. You are frequently using drugs or alcohol. You are engaging in risky behaviors that could lead to death. If you have any of these symptoms, get help right away. Call emergency services, go to your nearest emergency department or crisis center, or call a suicide crisis helpline. Summary Suicide is when you take your own life. Promise yourself that you will not do anything extreme when you have suicidal feelings. Let family, friends, teachers, or counselors know how you are feeling. Get help right away if you start making plans for how to commit suicide. This information is not  intended to replace advice given to you by your health care provider. Make sure you discuss any questions you have with your health care provider. Document Revised: 04/18/2020 Document Reviewed: 04/20/2020 Elsevier Patient Education  2022 Elsevier Inc.  

## 2021-04-17 NOTE — Assessment & Plan Note (Signed)
Chronic, ongoing, has never taken medication for this and is working on therapist at this time.  Denies SI/HI today, but does endorse occasional fleeting thoughts -- is in safe environment and has a safety plan in place.  At this time discussed various treatment options with him.  PHQ 9 = 15 and GAD = 15.  Discussed option of initiating SSRI, such as Paxil or Celexa which have an anticholinergic affect that may benefit anxiety + depression.  Educated on these = will trial Celexa starting at 10 MG and adjust as needed.  Educated on side effects and Black Box Warning -- aware to reach out immediately if SI presents or go to nearest ER.  Discussed that medication can take 4-6 weeks to reach full benefit.  Will plan on follow-up in 4 weeks, sooner if worsening mood.

## 2021-04-17 NOTE — Progress Notes (Signed)
There were no vitals taken for this visit.   Subjective:    Patient ID: Jay Hill, male    DOB: [birth year], 35 y.o.   MRN: [id number]  HPI: Jay Hill is a 35 y.o. male  Chief Complaint  Patient presents with   Focus Issues     Patient states he has been up and down lately and thinks work plays an factor into in. Patient states some days are harder to focus than others.    Depression    This visit was completed via telephone due to the restrictions of the COVID-19 pandemic. All issues as above were discussed and addressed but no physical exam was performed. If it was felt that the patient should be evaluated in the office, they were directed there. The patient verbally consented to this visit. Patient was unable to complete an audio/visual visit due to Lack of equipment. Due to the catastrophic nature of the COVID-19 pandemic, this visit was done through audio contact only. Location of the patient: home Location of the provider: work Those involved with this call:  Provider: Marnee Guarneri, DNP CMA: Irena Reichmann, [place] Desk/Registration: Barth Kirks  Time spent on call:  21 minutes on the phone discussing health concerns. 15 minutes total spent in review of patient's record and preparation of their chart.  I verified patient identity using two factors (patient name and date of birth). Patient consents verbally to being seen via telemedicine visit today.    DEPRESSION Has noticed changes in past couple months after starting a full time job -- more depression and anxiety at times.  Works for [employer]  Has struggled with depression in past, never taken medication. Mood status: uncontrolled Symptom severity: mild  Duration of current treatment : months Psychotherapy/counseling: yes in the past Previous psychiatric medications: none Depressed mood: yes Anxious mood: yes Anhedonia: no Significant weight loss or gain: no Insomnia: yes hard to stay  asleep Fatigue: yes Feelings of worthlessness or guilt: yes Impaired concentration/indecisiveness: yes Suicidal ideations: occasional thoughts, but no ideation or plan Hopelessness: none Crying spells: yes Depression screen [facility] 2/9 04/17/2021 11/15/2020  Decreased Interest 2 1  Down, Depressed, Hopeless 2 1  PHQ - 2 Score 4 2  Altered sleeping 1 3  Tired, decreased energy 3 3  Change in appetite 1 0  Feeling bad or failure about yourself  2 0  Trouble concentrating 2 1  Moving slowly or fidgety/restless 1 1  Suicidal thoughts 1 0  PHQ-9 Score 15 10  Difficult doing work/chores Very difficult Somewhat difficult    GAD 7 : Generalized Anxiety Score 04/17/2021  Nervous, Anxious, on Edge 2  Control/stop worrying 2  Worry too much - different things 3  Trouble relaxing 1  Restless 2  Easily annoyed or irritable 3  Afraid - awful might happen 2  Total GAD 7 Score 15  Anxiety Difficulty Very difficult    Relevant past medical, surgical, family and social history reviewed and updated as indicated. Interim medical history since our last visit reviewed. Allergies and medications reviewed and updated.  Review of Systems  Constitutional:  Negative for activity change, diaphoresis, fatigue and fever.  Respiratory:  Negative for cough, chest tightness, shortness of breath and wheezing.   Cardiovascular:  Negative for chest pain, palpitations and leg swelling.  Neurological: Negative.   Psychiatric/Behavioral:  Positive for decreased concentration and sleep disturbance. Negative for self-injury and suicidal ideas (occasional thoughts, but no ideation or plan). The patient is nervous/anxious.  Per HPI unless specifically indicated above     Objective:    There were no vitals taken for this visit.  Wt Readings from Last 3 Encounters:  11/15/20 214 lb 6 oz (97.2 kg)  12/25/16 210 lb (95.3 kg)    Physical Exam  Unable to perform due to telephone visit only.  Results for orders  placed or performed in visit on 11/15/20  Comp Met (CMET)  Result Value Ref Range   Glucose 78 65 - 99 mg/dL   BUN 19 6 - 20 mg/dL   Creatinine, Ser 0.94 0.76 - 1.27 mg/dL   eGFR 108 >59 mL/min/1.73   BUN/Creatinine Ratio 20 9 - 20   Sodium 137 134 - 144 mmol/L   Potassium 3.8 3.5 - 5.2 mmol/L   Chloride 98 96 - 106 mmol/L   CO2 21 20 - 29 mmol/L   Calcium 9.9 8.7 - 10.2 mg/dL   Total Protein 7.3 6.0 - 8.5 g/dL   Albumin 5.3 (H) 4.0 - 5.0 g/dL   Globulin, Total 2.0 1.5 - 4.5 g/dL   Albumin/Globulin Ratio 2.7 (H) 1.2 - 2.2   Bilirubin Total 0.3 0.0 - 1.2 mg/dL   Alkaline Phosphatase 54 44 - 121 IU/L   AST 18 0 - 40 IU/L   ALT 25 0 - 44 IU/L  CBC with Differential  Result Value Ref Range   WBC 8.8 3.4 - 10.8 x10E3/uL   RBC 5.10 4.14 - 5.80 x10E6/uL   Hemoglobin 15.6 13.0 - 17.7 g/dL   Hematocrit 46.0 37.5 - 51.0 %   MCV 90 79 - 97 fL   MCH 30.6 26.6 - 33.0 pg   MCHC 33.9 31.5 - 35.7 g/dL   RDW 12.0 11.6 - 15.4 %   Platelets 295 150 - 450 x10E3/uL   Neutrophils 60 Not Estab. %   Lymphs 23 Not Estab. %   Monocytes 11 Not Estab. %   Eos 4 Not Estab. %   Basos 1 Not Estab. %   Neutrophils Absolute 5.3 1.4 - 7.0 x10E3/uL   Lymphocytes Absolute 2.0 0.7 - 3.1 x10E3/uL   Monocytes Absolute 1.0 (H) 0.1 - 0.9 x10E3/uL   EOS (ABSOLUTE) 0.4 0.0 - 0.4 x10E3/uL   Basophils Absolute 0.1 0.0 - 0.2 x10E3/uL   Immature Granulocytes 1 Not Estab. %   Immature Grans (Abs) 0.1 0.0 - 0.1 x10E3/uL  TSH  Result Value Ref Range   TSH 0.774 0.450 - 4.500 uIU/mL  Lipid Panel w/o Chol/HDL Ratio  Result Value Ref Range   Cholesterol, Total 210 (H) 100 - 199 mg/dL   Triglycerides 291 (H) 0 - 149 mg/dL   HDL 40 >39 mg/dL   VLDL Cholesterol Cal 51 (H) 5 - 40 mg/dL   LDL Chol Calc (NIH) 119 (H) 0 - 99 mg/dL  Hepatitis C Antibody  Result Value Ref Range   Hep C Virus Ab <0.1 0.0 - 0.9 s/co ratio  HIV Antibody (routine testing w rflx)  Result Value Ref Range   HIV Screen 4th Generation wRfx Non  Reactive Non Reactive      Assessment & Plan:   Problem List Items Addressed This Visit       Other   Depression, recurrent ([place]) - Primary    Chronic, ongoing, has never taken medication for this and is working on therapist at this time.  Denies SI/HI today, but does endorse occasional fleeting thoughts -- is in safe environment and has a safety plan in place.  At this  time discussed various treatment options with him.  PHQ 9 = 15 and GAD = 15.  Discussed option of initiating SSRI, such as Paxil or Celexa which have an anticholinergic affect that may benefit anxiety + depression.  Educated on these = will trial Celexa starting at 10 MG and adjust as needed.  Educated on side effects and Black Box Warning -- aware to reach out immediately if SI presents or go to nearest ER.  Discussed that medication can take 4-6 weeks to reach full benefit.  Will plan on follow-up in 4 weeks, sooner if worsening mood.      Relevant Medications   citalopram (CELEXA) 10 MG tablet   Anxiety    Refer to depression plan of care.  Suspect much of focus issue comes we anxiety, however if ongoing focus issues could consider referral to psychiatry for ADD/ADHD testing.        Relevant Medications   citalopram (CELEXA) 10 MG tablet    I discussed the assessment and treatment plan with the patient. The patient was provided an opportunity to ask questions and all were answered. The patient agreed with the plan and demonstrated an understanding of the instructions.   The patient was advised to call back or seek an in-person evaluation if the symptoms worsen or if the condition fails to improve as anticipated.   I provided 21+ minutes of time during this encounter.   Follow up plan: Return in about 4 weeks (around 05/15/2021) for In office for Mood check.

## 2021-05-15 ENCOUNTER — Ambulatory Visit: Payer: No Typology Code available for payment source | Admitting: Nurse Practitioner

## 2021-05-21 ENCOUNTER — Ambulatory Visit: Payer: No Typology Code available for payment source | Admitting: Nurse Practitioner

## 2021-05-23 ENCOUNTER — Telehealth: Payer: Self-pay | Admitting: *Deleted

## 2021-05-23 ENCOUNTER — Encounter: Payer: Self-pay | Admitting: *Deleted

## 2021-05-23 DIAGNOSIS — T50Z95A Adverse effect of other vaccines and biological substances, initial encounter: Secondary | ICD-10-CM

## 2021-05-23 DIAGNOSIS — R5383 Other fatigue: Secondary | ICD-10-CM

## 2021-05-23 NOTE — Telephone Encounter (Signed)
Pt called out of work 10/5 citing adverse reaction to omicron covid booster received 10/4. No answer for NP Tina 10/5, mesg left. No answer today for RN. LVMRCB.

## 2021-05-23 NOTE — Telephone Encounter (Signed)
Reviewed RN Rolly Salter note symptoms resolved and patient returned to work today.  No covid testing indicated.  HR notified by RN Rolly Salter.

## 2021-05-23 NOTE — Telephone Encounter (Signed)
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 He reported extreme fatigue side effect from covid booster 10/4 leading to him calling out of work 10/5. Reports sx resolved last night and feels well today. Closing encounter.

## 2021-07-26 ENCOUNTER — Ambulatory Visit: Payer: No Typology Code available for payment source | Admitting: Nurse Practitioner

## 2021-08-28 ENCOUNTER — Other Ambulatory Visit: Payer: Self-pay

## 2021-08-28 ENCOUNTER — Encounter: Payer: Self-pay | Admitting: Nurse Practitioner

## 2021-08-28 ENCOUNTER — Ambulatory Visit: Payer: No Typology Code available for payment source | Admitting: Nurse Practitioner

## 2021-08-28 VITALS — BP 128/85 | HR 75 | Temp 98.5°F | Ht 73.0 in | Wt 210.2 lb

## 2021-08-28 DIAGNOSIS — F419 Anxiety disorder, unspecified: Secondary | ICD-10-CM

## 2021-08-28 DIAGNOSIS — R41840 Attention and concentration deficit: Secondary | ICD-10-CM | POA: Diagnosis not present

## 2021-08-28 DIAGNOSIS — F339 Major depressive disorder, recurrent, unspecified: Secondary | ICD-10-CM | POA: Diagnosis not present

## 2021-08-28 NOTE — Patient Instructions (Signed)
Living With Attention Deficit Hyperactivity Disorder If you have been diagnosed with attention deficit hyperactivity disorder (ADHD), you may be relieved that you now know why you have felt or behaved a certain way. Still, you may feel overwhelmed about the treatment ahead. You may also wonder how to get the support you need and how to deal with the condition day-to-day. With treatment and support, you can live with ADHD and manage your symptoms. How to manage lifestyle changes Managing stress Stress is your body's reaction to life changes and events, both good and bad. To cope with the stress of an ADHD diagnosis, it may help to: Learn more about ADHD. Exercise regularly. Even a short daily walk can lower stress levels. Participate in training or education programs (including social skills training classes) that teach you to deal with symptoms.  Medicines Your health care provider may suggest certain medicines if he or she feels that they will help to improve your condition. Stimulant medicines are usually prescribed to treat ADHD, and therapy may also be prescribed. It is important to: Avoid using alcohol and other substances that may prevent your medicines from working properly (may interact). Talk with your pharmacist or health care provider about all the medicines that you take, their possible side effects, and what medicines are safe to take together. Make it your goal to take part in all treatment decisions (shared decision-making). Ask about possible side effects of medicines that your health care provider recommends, and tell him or her how you feel about having those side effects. It is best if shared decision-making with your health care provider is part of your total treatment plan. Relationships To strengthen your relationships with family members while treating your condition, consider taking part in family therapy. You might also attend self-help groups alone or with a loved one. Be  honest about how your symptoms affect your relationships. Make an effort to communicate respectfully instead of fighting, and find ways to show others that you care. Psychotherapy may be useful in helping you cope with how ADHD affects your relationships. How to recognize changes in your condition The following signs may mean that your treatment is working well and your condition is improving: Consistently being on time for appointments. Being more organized at home and work. Other people noticing improvements in your behavior. Achieving goals that you set for yourself. Thinking more clearly. The following signs may mean that your treatment is not working very well: Feeling impatience or more confusion. Missing, forgetting, or being late for appointments. An increasing sense of disorganization and messiness. More difficulty in reaching goals that you set for yourself. Loved ones becoming angry or frustrated with you. Follow these instructions at home: Take over-the-counter and prescription medicines only as told by your health care provider. Check with your health care provider before taking any new medicines. Create structure and an organized atmosphere at home. For example: Make a list of tasks, then rank them from most important to least important. Work on one task at a time until your listed tasks are done. Make a daily schedule and follow it consistently every day. Use an appointment calendar, and check it 2 or 3 times a day to keep on track. Keep it with you when you leave the house. Create spaces where you keep certain things, and always put things back in their places after you use them. Keep all follow-up visits as told by your health care provider. This is important. Where to find support Talking to others    Keep emotion out of important discussions and speak in a calm, logical way. Listen closely and patiently to your loved ones. Try to understand their point of view, and try to  avoid getting defensive. Take responsibility for the consequences of your actions. Ask that others do not take your behaviors personally. Aim to solve problems as they come up, and express your feelings instead of bottling them up. Talk openly about what you need from your loved ones and how they can support you. Consider going to family therapy sessions or having your family meet with a specialist who deals with ADHD-related behavior problems. Finances Not all insurance plans cover mental health care, so it is important to check with your insurance carrier. If paying for co-pays or counseling services is a problem, search for a local or county mental health care center. Public mental health care services may be offered there at a low cost or no cost when you are not able to see a private health care provider. If you are taking medicine for ADHD, you may be able to get the generic form, which may be less expensive than brand-name medicine. Some makers of prescription medicines also offer help to patients who cannot afford the medicines that they need. Questions to ask your health care provider: What are the risks and benefits of taking medicines? Would I benefit from therapy? How often should I follow up with a health care provider? Contact a health care provider if: You have side effects from your medicines, such as: Repeated muscle twitches, coughing, or speech outbursts. Sleep problems. Loss of appetite. Depression. New or worsening behavior problems. Dizziness. Unusually fast heartbeat. Stomach pains. Headaches. Get help right away if: You have a severe reaction to a medicine. Your behavior suddenly gets worse. Summary With treatment and support, you can live with ADHD and manage your symptoms. The medicines that are most often prescribed for ADHD are stimulants. Consider taking part in family therapy or self-help groups with family members or friends. When you talk with friends  and family about your ADHD, be patient and communicate openly. Take over-the-counter and prescription medicines only as told by your health care provider. Check with your health care provider before taking any new medicines. This information is not intended to replace advice given to you by your health care provider. Make sure you discuss any questions you have with your health care provider. Document Revised: 01/18/2020 Document Reviewed: 01/18/2020 Elsevier Patient Education  2022 Elsevier Inc.  

## 2021-08-28 NOTE — Assessment & Plan Note (Signed)
Suspect much of focus issue may be coming from underlying ADD or ADHD, will place referral to attention specialists in [facility] for further testing and recommendations.

## 2021-08-28 NOTE — Progress Notes (Signed)
BP 128/85    Pulse 75    Temp 98.5 F (36.9 C) (Oral)    Ht _0  (1.854 m)    Wt 210 lb 3.2 oz (95.3 kg)    SpO2 98%    BMI 27.73 kg/m    Subjective:    Patient ID: Jay Hill, male    DOB: 1986-03-23, 36 y.o.   MRN: [id number]  HPI: Jay Hill is a 36 y.o. male  Chief Complaint  Patient presents with   Mood    Patient states the onset month was a bit much, but it eventually leveled out. Patient denies having any concerns at today's visit.    DEPRESSION Seen last on 04/17/21 for mood, was to follow-up in 4 weeks but missed follow-up.  Was started on Celexa 10 MG daily at that past visit and discussed psychiatry referral if ongoing focus issues, does still struggle with focusing on occasion.    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   Mood status: stable Symptom severity: mild  Duration of current treatment : months Psychotherapy/counseling: yes in the past Previous psychiatric medications: none Depressed mood: yes -- somewhat improved Anxious mood: yes -- somewhat improved Anhedonia: no Significant weight loss or gain: no Insomnia: yes hard to stay asleep -- varies, uses Melatonin Fatigue: yes Feelings of worthlessness or guilt: none Impaired concentration/indecisiveness: yes Suicidal ideations: none Hopelessness: none Crying spells: none Depression screen [facility] 2/9 08/28/2021 04/17/2021 11/15/2020  Decreased Interest _1 Down, Depressed, Hopeless _2 PHQ - 2 Score _3 Altered sleeping _4 Tired, decreased energy _5 Change in appetite 0 1 0  Feeling bad or failure about yourself  1 2 0  Trouble concentrating _6 Moving slowly or fidgety/restless 0 1 1  Suicidal thoughts 0 1 0  PHQ-9 Score _7 Difficult doing work/chores Somewhat difficult Very difficult Somewhat difficult    GAD 7 : Generalized Anxiety Score 08/28/2021 04/17/2021  Nervous, Anxious, on Edge 1 2   Control/stop worrying 1 2  Worry too much - different things 1 3  Trouble relaxing 1 1  Restless 1 2  Easily annoyed or irritable 1 3  Afraid - awful might happen 0 2  Total GAD 7 Score 6 15  Anxiety Difficulty Somewhat difficult Very difficult   Adult ADHD Self Report Scale (most recent)     Adult ADHD Self-Report Scale (ASRS-v1.1) Symptom Checklist - 08/28/21 1608       Part A   1. How often do you have trouble wrapping up the final details of a project, once the challenging parts have been done? Very Often  2. How often do you have difficulty getting things done in order when you have to do a task that requires organization? Very Often    3. How often do you have problems remembering appointments or obligations? Often  4. When you have a task that requires a lot of thought, how often do you avoid or delay getting started? Often    5. How often do you fidget or squirm with your hands or feet when you have to sit down for a long time? Very Often  6. How often do you feel overly active and compelled to do things, like you were driven by a motor? Often      Part B  7. How often do you make careless mistakes when you have to work on a boring or difficult project? Very Often  8. How often do you have difficulty keeping your attention when you are doing boring or repetitive work? Very Often    9. How often do you have difficulty concentrating on what people say to you, even when they are speaking to you directly? Never  10. How often do you misplace or have difficulty finding things at home or at work? Never    11. How often are you distracted by activity or noise around you? Sometimes  12. How often do you leave your seat in meetings or other situations in which you are expected to remain seated? Never    13. How often do you feel restless or fidgety? Often  14. How often do you have difficulty unwinding and relaxing when you have time to yourself? Often    15. How often do you find yourself  talking too much when you are in social situations? Very Often  65. When you are in a conversation, how often do you find yourself finishing the sentences of the people you are talking to, before they can finish them themselves? Never    17. How often do you have difficulty waiting your turn in situations when turn taking is required? Never  18. How often do you interrupt others when they are busy? Never      Comment   How old were you when these problems first began to occur? 14                Relevant past medical, surgical, family and social history reviewed and updated as indicated. Interim medical history since our last visit reviewed. Allergies and medications reviewed and updated.  Review of Systems  Per HPI unless specifically indicated above     Objective:    BP 128/85    Pulse 75    Temp 98.5 F (36.9 C) (Oral)    Ht _0  (1.854 m)    Wt 210 lb 3.2 oz (95.3 kg)    SpO2 98%    BMI 27.73 kg/m   Wt Readings from Last 3 Encounters:  08/28/21 210 lb 3.2 oz (95.3 kg)  11/15/20 214 lb 6 oz (97.2 kg)  12/25/16 210 lb (95.3 kg)    Physical Exam  Results for orders placed or performed in visit on 11/15/20  Comp Met (CMET)  Result Value Ref Range   Glucose 78 65 - 99 mg/dL   BUN 19 6 - 20 mg/dL   Creatinine, Ser 0.94 0.76 - 1.27 mg/dL   eGFR 108 >59 mL/min/1.73   BUN/Creatinine Ratio 20 9 - 20   Sodium 137 134 - 144 mmol/L   Potassium 3.8 3.5 - 5.2 mmol/L   Chloride 98 96 - 106 mmol/L   CO2 21 20 - 29 mmol/L   Calcium 9.9 8.7 - 10.2 mg/dL   Total Protein 7.3 6.0 - 8.5 g/dL   Albumin 5.3 (H) 4.0 - 5.0 g/dL   Globulin, Total 2.0 1.5 - 4.5 g/dL   Albumin/Globulin Ratio 2.7 (H) 1.2 - 2.2   Bilirubin Total 0.3 0.0 - 1.2 mg/dL   Alkaline Phosphatase 54 44 - 121 IU/L   AST 18 0 - 40 IU/L   ALT 25 0 - 44 IU/L  CBC with Differential  Result Value Ref Range   WBC 8.8 3.4 - 10.8 x10E3/uL   RBC 5.10 4.14 - 5.80 x10E6/uL  Hemoglobin 15.6 13.0 - 17.7 g/dL   Hematocrit  46.0 37.5 - 51.0 %   MCV 90 79 - 97 fL   MCH 30.6 26.6 - 33.0 pg   MCHC 33.9 31.5 - 35.7 g/dL   RDW 12.0 11.6 - 15.4 %   Platelets 295 150 - 450 x10E3/uL   Neutrophils 60 Not Estab. %   Lymphs 23 Not Estab. %   Monocytes 11 Not Estab. %   Eos 4 Not Estab. %   Basos 1 Not Estab. %   Neutrophils Absolute 5.3 1.4 - 7.0 x10E3/uL   Lymphocytes Absolute 2.0 0.7 - 3.1 x10E3/uL   Monocytes Absolute 1.0 (H) 0.1 - 0.9 x10E3/uL   EOS (ABSOLUTE) 0.4 0.0 - 0.4 x10E3/uL   Basophils Absolute 0.1 0.0 - 0.2 x10E3/uL   Immature Granulocytes 1 Not Estab. %   Immature Grans (Abs) 0.1 0.0 - 0.1 x10E3/uL  TSH  Result Value Ref Range   TSH 0.774 0.450 - 4.500 uIU/mL  Lipid Panel w/o Chol/HDL Ratio  Result Value Ref Range   Cholesterol, Total 210 (H) 100 - 199 mg/dL   Triglycerides 291 (H) 0 - 149 mg/dL   HDL 40 >39 mg/dL   VLDL Cholesterol Cal 51 (H) 5 - 40 mg/dL   LDL Chol Calc (NIH) 119 (H) 0 - 99 mg/dL  Hepatitis C Antibody  Result Value Ref Range   Hep C Virus Ab <0.1 0.0 - 0.9 s/co ratio  HIV Antibody (routine testing w rflx)  Result Value Ref Range   HIV Screen 4th Generation wRfx Non Reactive Non Reactive      Assessment & Plan:   Problem List Items Addressed This Visit       Other   Anxiety    Refer to depression plan of care -- improved anxiety score with Celexa.  Suspect much of focus issue may be coming from underlying ADD or ADHD, will place referral to attention specialists in [facility] for further testing and recommendations.      Relevant Orders   Ambulatory referral to Psychiatry   Depression, recurrent ([place]) - Primary    Chronic, ongoing, with improvement in mood noted with Celexa.  Denies SI/HI. PHQ 9 = 10 and GAD = 6 -- improved scores.  Continue Celexa 10 MG daily at this time, discussed with him he could trial 20 MG dosing at home (2 tablets) and if notices more benefit will send in refill on this dosing for him.  Educated on side effects and Black Box Warning -- aware  to reach out immediately if SI presents or go to nearest ER.  Referral to psychiatry for ADD/ADHD testing.  Return in 6 months for physical, sooner if worsening mood.      Impaired concentration    Suspect much of focus issue may be coming from underlying ADD or ADHD, will place referral to attention specialists in [facility] for further testing and recommendations.      Relevant Orders   Ambulatory referral to Psychiatry     Follow up plan: Return in about 6 months (around 02/25/2022) for Annual physical.

## 2021-08-28 NOTE — Assessment & Plan Note (Signed)
Chronic, ongoing, with improvement in mood noted with Celexa.  Denies SI/HI. PHQ 9 = 10 and GAD = 6 -- improved scores.  Continue Celexa 10 MG daily at this time, discussed with him he could trial 20 MG dosing at home (2 tablets) and if notices more benefit will send in refill on this dosing for him.  Educated on side effects and Black Box Warning -- aware to reach out immediately if SI presents or go to nearest ER.  Referral to psychiatry for ADD/ADHD testing.  Return in 6 months for physical, sooner if worsening mood.

## 2021-08-28 NOTE — Assessment & Plan Note (Signed)
Refer to depression plan of care -- improved anxiety score with Celexa.  Suspect much of focus issue may be coming from underlying ADD or ADHD, will place referral to attention specialists in [facility] for further testing and recommendations.

## 2021-08-31 ENCOUNTER — Other Ambulatory Visit: Payer: Self-pay | Admitting: Nurse Practitioner

## 2021-08-31 NOTE — Telephone Encounter (Signed)
Requested Prescriptions  Pending Prescriptions Disp Refills   citalopram (CELEXA) 10 MG tablet [Pharmacy Med Name: CITALOPRAM HBR 10 MG TABLET] 90 tablet 1    Sig: TAKE 1 TABLET BY MOUTH EVERY DAY     Psychiatry:  Antidepressants - SSRI Passed - 08/31/2021 10:32 AM      Passed - Completed PHQ-2 or PHQ-9 in the last 360 days      Passed - Valid encounter within last 6 months    Recent Outpatient Visits          3 days ago Depression, recurrent (HCC)   [facility] Cannady, Jolene T, NP   4 months ago Depression, recurrent (HCC)   [facility] Cannady, Dorie Rank, NP   9 months ago Encounter to establish care   [facility] Gerre Scull, NP

## 2021-12-31 ENCOUNTER — Other Ambulatory Visit: Payer: Self-pay | Admitting: Registered Nurse

## 2022-01-01 LAB — CMP12+LP+TP+TSH+6AC+CBC/D/PLT
ALT: 28 IU/L (ref 0–44)
AST: 16 IU/L (ref 0–40)
Albumin/Globulin Ratio: 2.4 — ABNORMAL HIGH (ref 1.2–2.2)
Albumin: 5 g/dL (ref 4.0–5.0)
Alkaline Phosphatase: 47 IU/L (ref 44–121)
BUN/Creatinine Ratio: 13 (ref 9–20)
BUN: 13 mg/dL (ref 6–20)
Basophils Absolute: 0.1 10*3/uL (ref 0.0–0.2)
Basos: 0 %
Bilirubin Total: 0.6 mg/dL (ref 0.0–1.2)
Calcium: 9.8 mg/dL (ref 8.7–10.2)
Chloride: 98 mmol/L (ref 96–106)
Chol/HDL Ratio: 3.9 ratio (ref 0.0–5.0)
Cholesterol, Total: 194 mg/dL (ref 100–199)
Creatinine, Ser: 1.01 mg/dL (ref 0.76–1.27)
EOS (ABSOLUTE): 0.3 10*3/uL (ref 0.0–0.4)
Eos: 2 %
Estimated CHD Risk: 0.7 times avg. (ref 0.0–1.0)
Free Thyroxine Index: 1.6 (ref 1.2–4.9)
GGT: 18 IU/L (ref 0–65)
Globulin, Total: 2.1 g/dL (ref 1.5–4.5)
Glucose: 77 mg/dL (ref 70–99)
HDL: 50 mg/dL (ref >39)
Hematocrit: 44.4 % (ref 37.5–51.0)
Hemoglobin: 14.7 g/dL (ref 13.0–17.7)
Immature Grans (Abs): 0 10*3/uL (ref 0.0–0.1)
Immature Granulocytes: 0 %
Iron: 119 ug/dL (ref 38–169)
LDH: 176 IU/L (ref 121–224)
LDL Chol Calc (NIH): 127 mg/dL — ABNORMAL HIGH (ref 0–99)
Lymphocytes Absolute: 1.9 10*3/uL (ref 0.7–3.1)
Lymphs: 16 %
MCH: 30.8 pg (ref 26.6–33.0)
MCHC: 33.1 g/dL (ref 31.5–35.7)
MCV: 93 fL (ref 79–97)
Monocytes Absolute: 1 10*3/uL — ABNORMAL HIGH (ref 0.1–0.9)
Monocytes: 8 %
Neutrophils Absolute: 8.7 10*3/uL — ABNORMAL HIGH (ref 1.4–7.0)
Neutrophils: 74 %
Phosphorus: 4.2 mg/dL — ABNORMAL HIGH (ref 2.8–4.1)
Platelets: 242 10*3/uL (ref 150–450)
Potassium: 4.6 mmol/L (ref 3.5–5.2)
RBC: 4.77 x10E6/uL (ref 4.14–5.80)
RDW: 12.6 % (ref 11.6–15.4)
Sodium: 138 mmol/L (ref 134–144)
T3 Uptake Ratio: 24 % (ref 24–39)
T4, Total: 6.6 ug/dL (ref 4.5–12.0)
TSH: 0.655 u[IU]/mL (ref 0.450–4.500)
Total Protein: 7.1 g/dL (ref 6.0–8.5)
Triglycerides: 93 mg/dL (ref 0–149)
Uric Acid: 6.1 mg/dL (ref 3.8–8.4)
VLDL Cholesterol Cal: 17 mg/dL (ref 5–40)
WBC: 11.9 10*3/uL — ABNORMAL HIGH (ref 3.4–10.8)
eGFR: 98 mL/min/{1.73_m2} (ref >59)

## 2022-01-01 LAB — HGB A1C W/O EAG: Hgb A1c MFr Bld: 5.2 % (ref 4.8–5.6)

## 2022-01-03 ENCOUNTER — Encounter: Payer: Self-pay | Admitting: Registered Nurse

## 2022-01-06 ENCOUNTER — Telehealth: Payer: Self-pay | Admitting: *Deleted

## 2022-01-06 NOTE — Progress Notes (Signed)
See separate tcon for result review RN notes.

## 2022-01-06 NOTE — Telephone Encounter (Signed)
Results reviewed with pt in clinic 5/19. Phosphorus slightly elevated. Total chol, triglycerides improved from last year but LDL worsened. WBC and neutrophils elevated. Denies recent illnesses. Diet and exercise recommendations discussed re: LDL. Routine f/u with pcp. Copy of labs provided to pt. No further questions/concerns.

## 2022-01-06 NOTE — Progress Notes (Signed)
Reviewed RN Rolly Salter tcon results review patient denied recent illness and was counseled on cholesterol dietary changes.

## 2022-01-09 ENCOUNTER — Ambulatory Visit: Payer: Self-pay | Admitting: *Deleted

## 2022-01-09 VITALS — BP 137/86 | HR 66 | Ht 72.5 in | Wt 212.0 lb

## 2022-01-09 DIAGNOSIS — Z0000 Encounter for general adult medical examination without abnormal findings: Secondary | ICD-10-CM

## 2022-01-09 NOTE — Progress Notes (Signed)
[employer] insurance premium discount evaluation: Labs Drawn by [facility] onsite. [facility] ROI form signed. Tobacco Free Attestation form signed.  Forms placed in paper chart.  

## 2022-02-09 ENCOUNTER — Other Ambulatory Visit: Payer: Self-pay | Admitting: Nurse Practitioner

## 2022-04-01 ENCOUNTER — Ambulatory Visit: Payer: Self-pay | Admitting: Registered Nurse

## 2022-04-01 ENCOUNTER — Encounter: Payer: Self-pay | Admitting: Registered Nurse

## 2022-04-01 NOTE — Patient Instructions (Addendum)
Jay Hill,  Reminder to contact your new pharmacy of choice and ask that your citalopram prescription be transferred to your new pharmacy if you have refills remaining.  If no refills remaining follow up/schedule appt with PCM.

## 2022-04-01 NOTE — Progress Notes (Signed)
Subjective:    Patient ID: Jay Hill, male    DOB: 1985-11-20, 36 y.o.   MRN: [id number]  36y/o caucasian male established patient here to see if mobic or citalopram on [facility] formulary at [facility].  Patient needs refill of both.  Denied HI/SI; is seeing counselor and states provider working well for him.      Review of Systems  Psychiatric/Behavioral:  Negative for behavioral problems, confusion, self-injury and suicidal ideas.        Objective:   Physical Exam Vitals and nursing note reviewed.  Constitutional:      General: He is awake. He is not in acute distress.    Appearance: Normal appearance. He is well-developed, well-groomed and overweight. He is not ill-appearing, toxic-appearing or diaphoretic.  HENT:     Head: Normocephalic and atraumatic.     Jaw: There is normal jaw occlusion.     Salivary Glands: Right salivary gland is not diffusely enlarged. Left salivary gland is not diffusely enlarged.     Right Ear: Hearing and external ear normal.     Left Ear: Hearing and external ear normal.     Nose: Nose normal. No congestion or rhinorrhea.     Mouth/Throat:     Lips: Pink. No lesions.     Mouth: Mucous membranes are moist.     Pharynx: Oropharynx is clear.  Eyes:     General: Lids are normal. Vision grossly intact. Gaze aligned appropriately. No scleral icterus.       Right eye: No discharge.        Left eye: No discharge.     Extraocular Movements: Extraocular movements intact.     Conjunctiva/sclera: Conjunctivae normal.     Pupils: Pupils are equal, round, and reactive to light.  Neck:     Trachea: Trachea normal.  Pulmonary:     Effort: Pulmonary effort is normal.     Breath sounds: Normal breath sounds and air entry. No stridor or transmitted upper airway sounds. No wheezing.     Comments: Spoke full sentences without difficulty; no cough observed in exam room Abdominal:     General: Abdomen is flat.  Musculoskeletal:        General: Normal  range of motion.     Cervical back: Normal range of motion and neck supple. No rigidity.     Right lower leg: No edema.     Left lower leg: No edema.  Lymphadenopathy:     Head:     Right side of head: No submandibular or preauricular adenopathy.     Left side of head: No submandibular or preauricular adenopathy.     Cervical:     Right cervical: No superficial cervical adenopathy.    Left cervical: No superficial cervical adenopathy.  Skin:    General: Skin is warm and dry.     Capillary Refill: Capillary refill takes less than 2 seconds.     Coloration: Skin is not ashen, cyanotic, jaundiced, mottled, pale or sallow.     Findings: No abrasion, bruising, burn, erythema, signs of injury, laceration, lesion, petechiae, rash or wound.  Neurological:     General: No focal deficit present.     Mental Status: He is alert and oriented to person, place, and time. Mental status is at baseline.     GCS: GCS eye subscore is 4. GCS verbal subscore is 5. GCS motor subscore is 6.     Cranial Nerves: Cranial nerves 2-12 are intact. No cranial nerve deficit,  dysarthria or facial asymmetry.     Motor: Motor function is intact. No weakness, tremor, atrophy, abnormal muscle tone or seizure activity.     Coordination: Coordination is intact. Coordination normal.     Gait: Gait is intact. Gait normal.     Comments: In/out of chair without difficulty; gait sure and steady in clinic; bilateral hand grasp equal 5/5  Psychiatric:        Attention and Perception: Attention and perception normal.        Mood and Affect: Mood and affect normal.        Speech: Speech normal.        Behavior: Behavior normal. Behavior is cooperative.        Thought Content: Thought content normal.        Cognition and Memory: Cognition and memory normal.        Judgment: Judgment normal.           Assessment & Plan:  A-encounter for medication review  P-patient stated not currently taking mobic.  Discussed with patient  he should not take both ibuprofen and mobic on the same day.  They are both NSAIDS and choose one that works best for him and do not take the other.  Discussed we do have ibuprofen on formulary at [facility] formulary if he obtains Rx he can fill onsite at no cost.  Mobic is not formulary.  Patient verbalized understanding information and had no further questions at this time.  Discussed with patient if refills on his citalopram prescription he can contact new pharmacy of choice and have them transfer rx from his current pharmacy.  Patient stated he is having difficulty getting refills filled when requested at current pharmacy.  Discussed with patient I am unable to prescribe controlled medications or mental health medications in this clinic due to contract limitations not allowed at [facility].  Discussed behavior health teledoc, EAP, UC, PCM options for patient.  Patient stated he will follow up with his counselor and PCM.  Denied HI/SI.  Patient verbalized understanding information/instructions and had no further questions at this time.

## 2022-04-20 NOTE — Patient Instructions (Addendum)
Try Voltaren gel over the counter.  Managing Anxiety, Adult After being diagnosed with anxiety, you may be relieved to know why you have felt or behaved a certain way. You may also feel overwhelmed about the treatment ahead and what it will mean for your life. With care and support, you can manage this condition. How to manage lifestyle changes Managing stress and anxiety  Stress is your body's reaction to life changes and events, both good and bad. Most stress will last just a few hours, but stress can be ongoing and can lead to more than just stress. Although stress can play a major role in anxiety, it is not the same as anxiety. Stress is usually caused by something external, such as a deadline, test, or competition. Stress normally passes after the triggering event has ended.  Anxiety is caused by something internal, such as imagining a terrible outcome or worrying that something will go wrong that will devastate you. Anxiety often does not go away even after the triggering event is over, and it can become long-term (chronic) worry. It is important to understand the differences between stress and anxiety and to manage your stress effectively so that it does not lead to an anxious response. Talk with your health care provider or a counselor to learn more about reducing anxiety and stress. He or she may suggest tension reduction techniques, such as: Music therapy. Spend time creating or listening to music that you enjoy and that inspires you. Mindfulness-based meditation. Practice being aware of your normal breaths while not trying to control your breathing. It can be done while sitting or walking. Centering prayer. This involves focusing on a word, phrase, or sacred image that means something to you and brings you peace. Deep breathing. To do this, expand your stomach and inhale slowly through your nose. Hold your breath for 3-5 seconds. Then exhale slowly, letting your stomach muscles  relax. Self-talk. Learn to notice and identify thought patterns that lead to anxiety reactions and change those patterns to thoughts that feel peaceful. Muscle relaxation. Taking time to tense muscles and then relax them. Choose a tension reduction technique that fits your lifestyle and personality. These techniques take time and practice. Set aside 5-15 minutes a day to do them. Therapists can offer counseling and training in these techniques. The training to help with anxiety may be covered by some insurance plans. Other things you can do to manage stress and anxiety include: Keeping a stress diary. This can help you learn what triggers your reaction and then learn ways to manage your response. Thinking about how you react to certain situations. You may not be able to control everything, but you can control your response. Making time for activities that help you relax and not feeling guilty about spending your time in this way. Doing visual imagery. This involves imagining or creating mental pictures to help you relax. Practicing yoga. Through yoga poses, you can lower tension and promote relaxation.  Medicines Medicines can help ease symptoms. Medicines for anxiety include: Antidepressant medicines. These are usually prescribed for long-term daily control. Anti-anxiety medicines. These may be added in severe cases, especially when panic attacks occur. Medicines will be prescribed by a health care provider. When used together, medicines, psychotherapy, and tension reduction techniques may be the most effective treatment. Relationships Relationships can play a big part in helping you recover. Try to spend more time connecting with trusted friends and family members. Consider going to couples counseling if you have a partner,  taking family education classes, or going to family therapy. Therapy can help you and others better understand your condition. How to recognize changes in your  anxiety Everyone responds differently to treatment for anxiety. Recovery from anxiety happens when symptoms decrease and stop interfering with your daily activities at home or work. This may mean that you will start to: Have better concentration and focus. Worry will interfere less in your daily thinking. Sleep better. Be less irritable. Have more energy. Have improved memory. It is also important to recognize when your condition is getting worse. Contact your health care provider if your symptoms interfere with home or work and you feel like your condition is not improving. Follow these instructions at home: Activity Exercise. Adults should do the following: Exercise for at least 150 minutes each week. The exercise should increase your heart rate and make you sweat (moderate-intensity exercise). Strengthening exercises at least twice a week. Get the right amount and quality of sleep. Most adults need 7-9 hours of sleep each night. Lifestyle  Eat a healthy diet that includes plenty of vegetables, fruits, whole grains, low-fat dairy products, and lean protein. Do not eat a lot of foods that are high in fats, added sugars, or salt (sodium). Make choices that simplify your life. Do not use any products that contain nicotine or tobacco. These products include cigarettes, chewing tobacco, and vaping devices, such as e-cigarettes. If you need help quitting, ask your health care provider. Avoid caffeine, alcohol, and certain over-the-counter cold medicines. These may make you feel worse. Ask your pharmacist which medicines to avoid. General instructions Take over-the-counter and prescription medicines only as told by your health care provider. Keep all follow-up visits. This is important. Where to find support You can get help and support from these sources: Self-help groups. Online and [employer]. A trusted spiritual leader. Couples counseling. Family education classes. Family  therapy. Where to find more information You may find that joining a support group helps you deal with your anxiety. The following sources can help you locate counselors or support groups near you: Mental Health America: www.mentalhealthamerica.net Anxiety and Depression Association of [place] (ADAA): ProgramCam.de [employer] on Mental Illness (NAMI): www.nami.org Contact a health care provider if: You have a hard time staying focused or finishing daily tasks. You spend many hours a day feeling worried about everyday life. You become exhausted by worry. You start to have headaches or frequently feel tense. You develop chronic nausea or diarrhea. Get help right away if: You have a racing heart and shortness of breath. You have thoughts of hurting yourself or others. If you ever feel like you may hurt yourself or others, or have thoughts about taking your own life, get help right away. Go to your nearest emergency department or: Call your local emergency services (911 in the [place] Call a suicide crisis helpline, such as the National Suicide Prevention Lifeline at (267)571-9008 or 988 in the [place] This is open 24 hours a day in the [place] Text the Crisis Text Line at 276-819-8072 (in the [place] Summary Taking steps to learn and use tension reduction techniques can help calm you and help prevent triggering an anxiety reaction. When used together, medicines, psychotherapy, and tension reduction techniques may be the most effective treatment. Family, friends, and partners can play a big part in supporting you. This information is not intended to replace advice given to you by your health care provider. Make sure you discuss any questions you have with your health care provider.  Document Revised: 02/27/2021 Document Reviewed: 11/25/2020 Elsevier Patient Education  2023 [employer].

## 2022-04-25 ENCOUNTER — Ambulatory Visit: Payer: No Typology Code available for payment source | Admitting: Nurse Practitioner

## 2022-04-25 ENCOUNTER — Encounter: Payer: Self-pay | Admitting: Nurse Practitioner

## 2022-04-25 VITALS — BP 125/87 | HR 85 | Temp 98.8°F | Ht 72.5 in | Wt 219.5 lb

## 2022-04-25 DIAGNOSIS — G8929 Other chronic pain: Secondary | ICD-10-CM | POA: Diagnosis not present

## 2022-04-25 DIAGNOSIS — M25512 Pain in left shoulder: Secondary | ICD-10-CM

## 2022-04-25 DIAGNOSIS — F419 Anxiety disorder, unspecified: Secondary | ICD-10-CM

## 2022-04-25 DIAGNOSIS — M25511 Pain in right shoulder: Secondary | ICD-10-CM

## 2022-04-25 DIAGNOSIS — F339 Major depressive disorder, recurrent, unspecified: Secondary | ICD-10-CM | POA: Diagnosis not present

## 2022-04-25 MED ORDER — MELOXICAM 15 MG PO TABS: 15.0000 mg | ORAL_TABLET | Freq: Every day | ORAL | 2 refills | Status: DC | PRN

## 2022-04-25 MED ORDER — CITALOPRAM HYDROBROMIDE 20 MG PO TABS: 20.0000 mg | ORAL_TABLET | Freq: Every day | ORAL | 4 refills | Status: DC

## 2022-04-25 NOTE — Assessment & Plan Note (Signed)
Chronic for months, ?related to neck vs shoulders due to work.  Will refill Meloxicam, although advise to minimally use due to Celexa use at home.  Referral to ortho placed.  Continue at home regimen -- Tylenol instead of Ibuprofen and recommend Voltaren gel.  Return for worsening or ongoing.

## 2022-04-25 NOTE — Assessment & Plan Note (Signed)
Chronic, stable with improved mood with Celexa.  Continue Celexa 20 MG daily at this time, refills sent.  Educated on side effects and Black Box Warning -- aware to reach out immediately if SI presents or go to nearest ER.  Referral to psychiatry for ADD/ADHD testing placed last visit, has not attended.  Return in 6 months for physical, sooner if worsening mood.

## 2022-04-25 NOTE — Assessment & Plan Note (Signed)
Refer to depression plan of care. 

## 2022-04-25 NOTE — Progress Notes (Signed)
BP 125/87 (BP Location: Left Arm, Cuff Size: Normal)   Pulse 85   Temp 98.8 F (37.1 C) (Oral)   Ht 6' 0.5" (1.842 m)   Wt 219 lb 8 oz (99.6 kg)   SpO2 95%   BMI 29.36 kg/m    Subjective:    Patient ID: Jay Hill, male    DOB: February 18, 1986, 36 y.o.   MRN: [id number]  HPI: Jay Hill is a 36 y.o. male  Chief Complaint  Patient presents with   Medication Refill    Patient is here for a Medication Refill appointment on his Citalopram. Patient denies having any concerns at today's visit.    Shoulder Pain    Patient says he is experiencing shoulder pain in his R shoulder. Patient says he is in a new position at work, where he is working the shoulder more than usual. Patient says he had an old prescription for Meloxicam and he took a few last week and it helped his shoulder.    ANXIETY Continues on Celexa he increased to 20 MG and found more benefit from this. Mood status: stable Satisfied with current treatment?: yes Symptom severity: moderate  Duration of current treatment : chronic Side effects: no Medication compliance: good compliance Psychotherapy/counseling: yes current Depressed mood: yes Anxious mood: no Anhedonia: no Significant weight loss or gain: no Insomnia: yes hard to stay asleep Fatigue: yes Feelings of worthlessness or guilt: no Impaired concentration/indecisiveness:  a little bit Suicidal ideations: no Hopelessness: no Crying spells: occasional     04/25/2022    3:46 PM 08/28/2021    4:02 PM 04/17/2021    2:48 PM 11/15/2020    4:03 PM  Depression screen PHQ 2/9  Decreased Interest _0 Down, Depressed, Hopeless _1 PHQ - 2 Score _2 Altered sleeping _3 Tired, decreased energy _4 Change in appetite 0 0 1 0  Feeling bad or failure about yourself  0 1 2 0  Trouble concentrating _5 Moving slowly or fidgety/restless 0 0 1 1  Suicidal thoughts 0 0 1 0  PHQ-9 Score _6 Difficult doing work/chores Very  difficult Somewhat difficult Very difficult Somewhat difficult       04/25/2022    3:47 PM 08/28/2021    4:03 PM 04/17/2021    2:51 PM  GAD 7 : Generalized Anxiety Score  Nervous, Anxious, on Edge _7 Control/stop worrying _8 Worry too much - different things 0 1 3  Trouble relaxing _9 Restless 0 1 2  Easily annoyed or irritable _10 Afraid - awful might happen 0 0 2  Total GAD 7 Score _11 Anxiety Difficulty Somewhat difficult Somewhat difficult Very difficult   SHOULDER PAIN Present to both shoulders, right shoulder worse then left.  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   Duration: weeks Involved shoulder: bilateral R>L Mechanism of injury: unknown -- years ago fell  Location: posterior Onset:gradual Severity: 8/10  Quality:  dull, aching, and throbbing Frequency: intermittent Radiation: yes radiates down arms Aggravating factors: lifting and movement  Alleviating factors: Meloxicam, massage gun  Status: stable Treatments attempted: ice, APAP, and ibuprofen  Relief with NSAIDs?:  moderate Weakness: no Numbness: yes down right arm Decreased grip strength: no Redness: no Swelling: no Bruising: no Fevers: no  Relevant past medical, surgical, family and social history reviewed and updated as indicated. Interim medical history since our last visit reviewed. Allergies and medications reviewed and updated.  Review of Systems  Constitutional:  Negative for activity change, diaphoresis, fatigue and fever.  Respiratory:  Negative for cough, chest tightness, shortness of breath and wheezing.   Cardiovascular:  Negative for chest pain, palpitations and leg swelling.  Musculoskeletal:  Positive for arthralgias.  Neurological: Negative.   Psychiatric/Behavioral:  Positive for decreased concentration and sleep disturbance. Negative for self-injury and suicidal ideas. The patient is nervous/anxious.     Per HPI unless specifically indicated  above     Objective:    BP 125/87 (BP Location: Left Arm, Cuff Size: Normal)   Pulse 85   Temp 98.8 F (37.1 C) (Oral)   Ht 6' 0.5" (1.842 m)   Wt 219 lb 8 oz (99.6 kg)   SpO2 95%   BMI 29.36 kg/m   Wt Readings from Last 3 Encounters:  04/25/22 219 lb 8 oz (99.6 kg)  01/09/22 212 lb (96.2 kg)  08/28/21 210 lb 3.2 oz (95.3 kg)    Physical Exam Vitals and nursing note reviewed.  Constitutional:      General: He is awake. He is not in acute distress.    Appearance: He is well-developed and well-groomed. He is not ill-appearing or toxic-appearing.  HENT:     Head: Normocephalic and atraumatic.     Right Ear: Hearing normal. No drainage.     Left Ear: Hearing normal. No drainage.  Eyes:     General: Lids are normal.        Right eye: No discharge.        Left eye: No discharge.     Conjunctiva/sclera: Conjunctivae normal.     Pupils: Pupils are equal, round, and reactive to light.  Neck:     Thyroid: No thyromegaly.     Vascular: No carotid bruit.  Cardiovascular:     Rate and Rhythm: Normal rate and regular rhythm.     Heart sounds: Normal heart sounds, S1 normal and S2 normal. No murmur heard.    No gallop.  Pulmonary:     Effort: Pulmonary effort is normal. No accessory muscle usage or respiratory distress.     Breath sounds: Normal breath sounds.  Abdominal:     General: Bowel sounds are normal.     Palpations: Abdomen is soft.  Musculoskeletal:     Right shoulder: Tenderness present. No swelling or crepitus. Normal range of motion. Decreased strength. Normal pulse.     Left shoulder: Tenderness present. No swelling or crepitus. Normal range of motion. Decreased strength. Normal pulse.     Cervical back: Normal range of motion and neck supple.     Right lower leg: No edema.     Left lower leg: No edema.     Comments: Tenderness with lateral extension bilaterally and flexion. Decreased strength bilaterally.  Skin:    General: Skin is warm and dry.     Capillary  Refill: Capillary refill takes less than 2 seconds.  Neurological:     Mental Status: He is alert and oriented to person, place, and time.     Deep Tendon Reflexes: Reflexes are normal and symmetric.     Reflex Scores:      Brachioradialis reflexes are 2+ on the right side and 2+ on the left side.      Patellar reflexes are 2+ on the right side and 2+ on the  left side. Psychiatric:        Attention and Perception: Attention normal.        Mood and Affect: Mood normal.        Speech: Speech normal.        Behavior: Behavior normal. Behavior is cooperative.        Thought Content: Thought content normal.     Results for orders placed or performed in visit on 12/31/21  CMP12+LP+TP+TSH+6AC+CBC/D/Plt  Result Value Ref Range   Glucose 77 70 - 99 mg/dL   Uric Acid 6.1 3.8 - 8.4 mg/dL   BUN 13 6 - 20 mg/dL   Creatinine, Ser 1.01 0.76 - 1.27 mg/dL   eGFR 98 >59 mL/min/1.73   BUN/Creatinine Ratio 13 9 - 20   Sodium 138 134 - 144 mmol/L   Potassium 4.6 3.5 - 5.2 mmol/L   Chloride 98 96 - 106 mmol/L   Calcium 9.8 8.7 - 10.2 mg/dL   Phosphorus 4.2 (H) 2.8 - 4.1 mg/dL   Total Protein 7.1 6.0 - 8.5 g/dL   Albumin 5.0 4.0 - 5.0 g/dL   Globulin, Total 2.1 1.5 - 4.5 g/dL   Albumin/Globulin Ratio 2.4 (H) 1.2 - 2.2   Bilirubin Total 0.6 0.0 - 1.2 mg/dL   Alkaline Phosphatase 47 44 - 121 IU/L   LDH 176 121 - 224 IU/L   AST 16 0 - 40 IU/L   ALT 28 0 - 44 IU/L   GGT 18 0 - 65 IU/L   Iron 119 38 - 169 ug/dL   Cholesterol, Total 194 100 - 199 mg/dL   Triglycerides 93 0 - 149 mg/dL   HDL 50 >39 mg/dL   VLDL Cholesterol Cal 17 5 - 40 mg/dL   LDL Chol Calc (NIH) 127 (H) 0 - 99 mg/dL   Chol/HDL Ratio 3.9 0.0 - 5.0 ratio   Estimated CHD Risk 0.7 0.0 - 1.0 times avg.   TSH 0.655 0.450 - 4.500 uIU/mL   T4, Total 6.6 4.5 - 12.0 ug/dL   T3 Uptake Ratio 24 24 - 39 %   Free Thyroxine Index 1.6 1.2 - 4.9   WBC 11.9 (H) 3.4 - 10.8 x10E3/uL   RBC 4.77 4.14 - 5.80 x10E6/uL   Hemoglobin 14.7 13.0 - 17.7  g/dL   Hematocrit 44.4 37.5 - 51.0 %   MCV 93 79 - 97 fL   MCH 30.8 26.6 - 33.0 pg   MCHC 33.1 31.5 - 35.7 g/dL   RDW 12.6 11.6 - 15.4 %   Platelets 242 150 - 450 x10E3/uL   Neutrophils 74 Not Estab. %   Lymphs 16 Not Estab. %   Monocytes 8 Not Estab. %   Eos 2 Not Estab. %   Basos 0 Not Estab. %   Neutrophils Absolute 8.7 (H) 1.4 - 7.0 x10E3/uL   Lymphocytes Absolute 1.9 0.7 - 3.1 x10E3/uL   Monocytes Absolute 1.0 (H) 0.1 - 0.9 x10E3/uL   EOS (ABSOLUTE) 0.3 0.0 - 0.4 x10E3/uL   Basophils Absolute 0.1 0.0 - 0.2 x10E3/uL   Immature Granulocytes 0 Not Estab. %   Immature Grans (Abs) 0.0 0.0 - 0.1 x10E3/uL  Hgb A1c w/o eAG  Result Value Ref Range   Hgb A1c MFr Bld 5.2 4.8 - 5.6 %      Assessment & Plan:   Problem List Items Addressed This Visit       Other   Anxiety    Refer to depression plan of care.  Relevant Medications   citalopram (CELEXA) 20 MG tablet   Chronic pain of both shoulders    Chronic for months, ?related to neck vs shoulders due to work.  Will refill Meloxicam, although advise to minimally use due to Celexa use at home.  Referral to ortho placed.  Continue at home regimen -- Tylenol instead of Ibuprofen and recommend Voltaren gel.  Return for worsening or ongoing.      Relevant Medications   citalopram (CELEXA) 20 MG tablet   meloxicam (MOBIC) 15 MG tablet   Other Relevant Orders   Ambulatory referral to Orthopedics   Depression, recurrent ([place]) - Primary    Chronic, stable with improved mood with Celexa.  Continue Celexa 20 MG daily at this time, refills sent.  Educated on side effects and Black Box Warning -- aware to reach out immediately if SI presents or go to nearest ER.  Referral to psychiatry for ADD/ADHD testing placed last visit, has not attended.  Return in 6 months for physical, sooner if worsening mood.      Relevant Medications   citalopram (CELEXA) 20 MG tablet     Follow up plan: Return in about 6 months (around 10/24/2022) for  Annual physical.

## 2022-07-14 ENCOUNTER — Telehealth: Payer: Self-pay | Admitting: Registered Nurse

## 2022-07-14 ENCOUNTER — Encounter: Payer: Self-pay | Admitting: Registered Nurse

## 2022-07-14 DIAGNOSIS — U071 COVID-19: Secondary | ICD-10-CM

## 2022-07-14 MED ORDER — FLUTICASONE PROPIONATE 50 MCG/ACT NA SUSP: 1.0000 | Freq: Two times a day (BID) | NASAL | 0 refills | Status: DC

## 2022-07-14 MED ORDER — NIRMATRELVIR/RITONAVIR (PAXLOVID)TABLET: 3.0000 | ORAL_TABLET | Freq: Two times a day (BID) | ORAL | 0 refills | Status: AC

## 2022-07-14 MED ORDER — ALBUTEROL SULFATE HFA 108 (90 BASE) MCG/ACT IN AERS: 1.0000 | INHALATION_SPRAY | RESPIRATORY_TRACT | 0 refills | Status: DC | PRN

## 2022-07-14 MED ORDER — SALINE SPRAY 0.65 % NA SOLN: 2.0000 | NASAL | 0 refills | Status: DC

## 2022-07-14 NOTE — Telephone Encounter (Signed)
Patient notified NP positive home test 07/11/2022 Day 0 fatigue, cough and congestion stayed home 11/25 and today.  Does not work remote.  Supervisor Jay Hill Jay Hill.  Pt began quarantine at that time.   5 day quarantine per [facility] recommendations. Day 1 of quarantine was 07/12/2022.  Day 5 07/16/22  estimated RTW 07/17/22 Day 6 and Day 10 07/21/22 Patient to contact clinic staff if vomiting after coughing or unable to tolerate po fluids.   If GI upset I have recommended clear fluids then bland diet.  Avoid dairy/spicy, fried and large portions of meat while having nausea.  If vomiting hold po intake x 1 hour.  Then sips clear fluids like broths, ginger ale, power ade, gatorade, pedialyte may advance to soft/bland if no vomiting x 24 hours and appetite returned otherwise hydration main focus. Call Kimrey or email [email or web address] if symptoms not improved with plan of care    Reviewed possible Covid symptoms including cough, shortness of breath with exertion or at rest, runny nose, congestion, sinus pain/pressure, sore throat, fever/chills, body aches, fatigue, loss of taste/smell, GI symptoms of nausea/vomiting/diarrhea. Also reviewed same day/emergent eval/ER precautions of dizziness/syncope, confusion, blue tint to lips/face, severe shortness of breath/difficulty breathing/wheezing.   Patient does not have sp02 monitor at home.  Patient to isolate in own room and if possible use only one bathroom if living with others in home.  Wear mask when out of room to help prevent spread to others in household.  Sanitize high touch surfaces with lysol/chlorox/bleach spray or wipes daily as viruses are known to live on surfaces from 24 hours to days.  Patient does want antivirals as symptoms not improving today Day 4.  Patient at higher risk for hospitalization due to obesity, hypertension and elevated LDL.  Patient is not up to date on covid vaccines did not receive new fall formula last vaccine winter  2023.  Recommend annual booster or to start series 60 days after infection resolution.  Patient is on prescription medications or daily medications. If taking medications lexicomp interaction checker used to verify if any drug interactions. Only taking OTC cough/cold/fever medication at this time dayquil/nyquil/celexa.  Mobic is prn not daily.  Patient paxlovid emergency use handout sent to patient electronically along with covid quarantine exitcare handout both in my chart and to personal email.  Discussed how to take paxlovid e.g. 3 pills 2 of nirmatrelvir 150mg  and 1 of ritonavir 100mg  am/pm x 5 days #20/10 RF0 electronic Rx sent to his pharmacy of choice.  Alternate pharmacy [facility] on [address], [place].  Discussed I will verify with pharmacy if in stock and if not call him to notify Rx sent to alternative pharmacy. Discussed lemonade can sometimes help with metallic/plastic taste in mouth (side effect medication).  Discussed most common side effects GI upset and bad taste in mouth.  Use birth control/avoid getting pregnant while on paxlovid and no breastfeeding.  Discussed I recommended not having sex with anyone while sick/testing positive/10 day quarantine as could spread virus to partner.  Discussed with patient Evlyn Kanner would call tomorrow to see if symptoms improving or questions/concerns.  Exitcare handouts on covid quarantine/home care sent to patient my chart/email.  FDA handout on paxlovid sent to patient.  Last positive covid test 03/20/2021 per Epic review.  Patient has OTC cough/cold with phenylephrine/chlorpheniramine/tylenol take per manufacturer instructions as working for him.  May use salt water gargles and nasal saline 2 sprays each nostril q2h prn congestion/sore throat.  Research has  shown it helps to prevent hospitalizations and decrease discomfort.   May use flonase nasal 1 spray each nostril BID prn rhinitis patient has at home.  Patient has used tessalon pearles in  the past and doesn't want rx this time.  Patient has required albuterol inhaler in the past.  Ran out would like Rx sent to his pharmacy.  Electronic Rx to his pharmacy of choice albuterol 164mcg/act  sig 1-2 puffs po q4-6h prn protracted cough/wheezing/chest tightness #1 RF0  Discussed honey 1 tablespoon every 4 hours is a natural cough suppressant. Avoid dehydration and drink water to keep urine pale yellow clear and voiding every 2-4 hours while awake.  Patient alert and oriented x3, spoke full sentences without difficulty.  Some nasal congestion audible during 13 minute call.  No audible cough/throat clearing/hoarse voice/wheezing/shortness of breath during telephone call.   Discussed with patient can contact NP Inetta Fermo through my [phone] or [email or web address] when clinic closed if questions or concerns.  RN Kimrey M-Th [phone] hours [phone] or [email or web address].   Pt verbalized understanding and agreement with plan of care. No further questions/concerns at this time. Pt reminded to contact clinic with any changes in symptoms or questions/concerns. RN Bess Kinds will notify supervisor and HR excused absence picture received today quarantine through Day 5 RTW estimated Day 6 with strict mask wear through Day 10 and no eating in employee lunch room.  Estimated return to work onsite 07/17/22

## 2022-07-16 ENCOUNTER — Ambulatory Visit: Payer: Self-pay | Admitting: Occupational Medicine

## 2022-07-16 DIAGNOSIS — U071 COVID-19: Secondary | ICD-10-CM

## 2022-07-16 NOTE — Progress Notes (Signed)
Phone follow up call prior to coming back to work. Patient Fever free. No NVD. Pt feeling much better. Patient reports awaiting for home test to come in the mail so could test negative before coming back. Educated no need for negative test. Comes back with face mask for 5 days or can do two separate  covid test once negative doesn't have to wear mask. Educated if any further issues to come to the clinic.

## 2022-07-18 NOTE — Telephone Encounter (Signed)
Patient sent email with negative home covid test results yesterday and today.  Message sent to HR patient may discontinue mask wear at work and eat in employee lunch room again.  Telephone message left for patient to verify if all symptoms resolved and if any questions or concerns.

## 2022-07-22 ENCOUNTER — Ambulatory Visit: Payer: Self-pay | Admitting: Occupational Medicine

## 2022-07-24 NOTE — Telephone Encounter (Signed)
Patient seen in clinic after visit with RN [facility] for flu vaccination.  Feeling well denied concerns requested flu vaccination afebrile and symptoms resolved from covid infection.  Tolerated well waited 15 minutes after immunization in clinic without side effects/concerns.  A&Ox3 spoke full sentences without difficulty respirations even and unlabored skin warm dry and pink gait sure and steady in clinic on ambulatory departure.

## 2022-09-05 ENCOUNTER — Other Ambulatory Visit (HOSPITAL_BASED_OUTPATIENT_CLINIC_OR_DEPARTMENT_OTHER): Payer: Self-pay

## 2022-09-05 MED ORDER — COVID-19 MRNA 2023-2024 VACCINE (COMIRNATY) 0.3 ML INJECTION
0.3000 mL | Freq: Once | INTRAMUSCULAR | 0 refills | Status: AC
  Filled 2022-09-05: qty 0.3, 1d supply, fill #0

## 2022-10-07 ENCOUNTER — Encounter: Payer: Self-pay | Admitting: Nurse Practitioner

## 2022-10-14 ENCOUNTER — Telehealth: Payer: Self-pay | Admitting: Registered Nurse

## 2022-10-14 ENCOUNTER — Encounter: Payer: Self-pay | Admitting: Registered Nurse

## 2022-10-14 DIAGNOSIS — E559 Vitamin D deficiency, unspecified: Secondary | ICD-10-CM

## 2022-10-14 DIAGNOSIS — Z0000 Encounter for general adult medical examination without abnormal findings: Secondary | ICD-10-CM

## 2022-10-14 NOTE — Telephone Encounter (Signed)
Appt scheduled 10/16/22 fasting labs ordered vitamin D, hgba1c and executive panel RN Kimrey notified

## 2022-10-15 ENCOUNTER — Other Ambulatory Visit: Payer: Self-pay | Admitting: Occupational Medicine

## 2022-10-15 DIAGNOSIS — Z0000 Encounter for general adult medical examination without abnormal findings: Secondary | ICD-10-CM

## 2022-10-15 NOTE — Progress Notes (Signed)
Lab drawn from Left AC tolerated well no issues noted.

## 2022-10-16 ENCOUNTER — Encounter: Payer: Self-pay | Admitting: Registered Nurse

## 2022-10-16 LAB — CMP12+LP+TP+TSH+6AC+CBC/D/PLT
ALT: 19 IU/L (ref 0–44)
AST: 14 IU/L (ref 0–40)
Albumin/Globulin Ratio: 2.3 — ABNORMAL HIGH (ref 1.2–2.2)
Albumin: 5.1 g/dL (ref 4.1–5.1)
Alkaline Phosphatase: 48 IU/L (ref 44–121)
BUN/Creatinine Ratio: 13 (ref 9–20)
BUN: 14 mg/dL (ref 6–20)
Basophils Absolute: 0.1 10*3/uL (ref 0.0–0.2)
Basos: 1 %
Bilirubin Total: 0.8 mg/dL (ref 0.0–1.2)
Calcium: 9.6 mg/dL (ref 8.7–10.2)
Chloride: 101 mmol/L (ref 96–106)
Chol/HDL Ratio: 4.2 ratio (ref 0.0–5.0)
Cholesterol, Total: 182 mg/dL (ref 100–199)
Creatinine, Ser: 1.04 mg/dL (ref 0.76–1.27)
EOS (ABSOLUTE): 0.3 10*3/uL (ref 0.0–0.4)
Eos: 5 %
Estimated CHD Risk: 0.8 times avg. (ref 0.0–1.0)
Free Thyroxine Index: 1.8 (ref 1.2–4.9)
GGT: 18 IU/L (ref 0–65)
Globulin, Total: 2.2 g/dL (ref 1.5–4.5)
Glucose: 88 mg/dL (ref 70–99)
HDL: 43 mg/dL (ref >39)
Hematocrit: 45.9 % (ref 37.5–51.0)
Hemoglobin: 15.2 g/dL (ref 13.0–17.7)
Immature Grans (Abs): 0 10*3/uL (ref 0.0–0.1)
Immature Granulocytes: 0 %
Iron: 195 ug/dL — ABNORMAL HIGH (ref 38–169)
LDH: 161 IU/L (ref 121–224)
LDL Chol Calc (NIH): 118 mg/dL — ABNORMAL HIGH (ref 0–99)
Lymphocytes Absolute: 1.5 10*3/uL (ref 0.7–3.1)
Lymphs: 26 %
MCH: 30.4 pg (ref 26.6–33.0)
MCHC: 33.1 g/dL (ref 31.5–35.7)
MCV: 92 fL (ref 79–97)
Monocytes Absolute: 0.6 10*3/uL (ref 0.1–0.9)
Monocytes: 10 %
Neutrophils Absolute: 3.3 10*3/uL (ref 1.4–7.0)
Neutrophils: 58 %
Phosphorus: 3.9 mg/dL (ref 2.8–4.1)
Platelets: 246 10*3/uL (ref 150–450)
Potassium: 4.6 mmol/L (ref 3.5–5.2)
RBC: 5 x10E6/uL (ref 4.14–5.80)
RDW: 12.2 % (ref 11.6–15.4)
Sodium: 141 mmol/L (ref 134–144)
T3 Uptake Ratio: 28 % (ref 24–39)
T4, Total: 6.6 ug/dL (ref 4.5–12.0)
TSH: 1.1 u[IU]/mL (ref 0.450–4.500)
Total Protein: 7.3 g/dL (ref 6.0–8.5)
Triglycerides: 114 mg/dL (ref 0–149)
Uric Acid: 7.8 mg/dL (ref 3.8–8.4)
VLDL Cholesterol Cal: 21 mg/dL (ref 5–40)
WBC: 5.7 10*3/uL (ref 3.4–10.8)
eGFR: 94 mL/min/{1.73_m2} (ref >59)

## 2022-10-16 LAB — VITAMIN D 25 HYDROXY (VIT D DEFICIENCY, FRACTURES): Vit D, 25-Hydroxy: 16 ng/mL — ABNORMAL LOW (ref 30.0–100.0)

## 2022-10-16 LAB — HEMOGLOBIN A1C
Est. average glucose Bld gHb Est-mCnc: 105 mg/dL
Hgb A1c MFr Bld: 5.3 % (ref 4.8–5.6)

## 2022-10-16 MED ORDER — CHOLECALCIFEROL 1.25 MG (50000 UT) PO TABS: 1.0000 | ORAL_TABLET | ORAL | 0 refills | Status: DC

## 2022-10-16 NOTE — Addendum Note (Signed)
Addended by: Gerarda Fraction A on: 10/16/2022 12:09 PM   Modules accepted: Orders

## 2022-10-21 ENCOUNTER — Ambulatory Visit: Payer: Self-pay | Admitting: Occupational Medicine

## 2022-10-21 VITALS — BP 126/88 | Ht 72.0 in | Wt 221.2 lb

## 2022-10-21 DIAGNOSIS — Z0000 Encounter for general adult medical examination without abnormal findings: Secondary | ICD-10-CM

## 2022-10-21 DIAGNOSIS — E559 Vitamin D deficiency, unspecified: Secondary | ICD-10-CM

## 2022-10-21 NOTE — Progress Notes (Signed)
Be well insurance premium discount evaluation:   Epic reviewed by RN Evlyn Kanner and transcribed labs.  Tobacco attestation signed. [facility] ROI formed signed. Forms placed in the chart.   Patient given handouts for [place] pharmacies and discount drugs list,MyChart, Tele doc setup, Tele doc Behavioral, [place] counseling and [facility] counseling.  What to do for infectious illness protocol. Given handout for list of medications that can be filled at [facility]. Given Clinic hours and Clinic Email.   Schedule lab re checks. Also dicussed weight loss.

## 2022-10-22 NOTE — Progress Notes (Signed)
Noted that patient reviewed my chart message 10/16/22 and spoke with RN Evlyn Kanner regarding his results/supplements and follow up lab appts made.  Noted that patient drinks city water.  Most likely iron from his supplements.  Taking fiber, multivitamin and herb ashwagandha.  BP 126/88 weight 221lbs BMI 29.97 weight last Be Well appt 214lbs and 2023 Be Well 212 lbs.  My chart message sent to patient  Jay Hill,  Can you please take a picture of the ingredients/label on your multivitamin and sent to [place] [email or web address].  I reviewed NIH website regarding ashwagandha.  There is some evidence short term use less than 3 months is safe but longer use/side effects has not been studied.  Noted that drowsiness, stomach upset, diarrhea and vomiting most commonly reported side effects of taking this herb.  Liver injury has also been reported.  NIH recommends not taking if you are going to have surgery, have autoimmune or thyroid problems.  It can also interact with diabetes, high blood pressure, immunosuppressants, sedatives, seizure and thyroid medications.  Because it can increase testosterone levels if diagnosed with prostate cancer you should avoid use also.  Most likely elevated iron from one or more of your supplements use.  Excess iron in the body can cause organ damage.  If you have been taking ashwagandha longer than 3 months I recommend stopping.    Keep appts as scheduled with RN Kimrey to do follow up level checks.  Please let us know if you have further questions.  Sincerely,  Gerarda Fraction NP-C

## 2022-10-24 ENCOUNTER — Encounter: Payer: No Typology Code available for payment source | Admitting: Nurse Practitioner

## 2022-10-25 NOTE — Patient Instructions (Signed)

## 2022-10-27 ENCOUNTER — Encounter: Payer: Self-pay | Admitting: Nurse Practitioner

## 2022-10-27 ENCOUNTER — Ambulatory Visit (INDEPENDENT_AMBULATORY_CARE_PROVIDER_SITE_OTHER): Payer: No Typology Code available for payment source | Admitting: Nurse Practitioner

## 2022-10-27 VITALS — BP 137/85 | HR 78 | Temp 98.2°F | Ht 72.52 in | Wt 227.2 lb

## 2022-10-27 DIAGNOSIS — Z0000 Encounter for general adult medical examination without abnormal findings: Secondary | ICD-10-CM

## 2022-10-27 DIAGNOSIS — E559 Vitamin D deficiency, unspecified: Secondary | ICD-10-CM

## 2022-10-27 DIAGNOSIS — R41840 Attention and concentration deficit: Secondary | ICD-10-CM | POA: Diagnosis not present

## 2022-10-27 DIAGNOSIS — Z87891 Personal history of nicotine dependence: Secondary | ICD-10-CM

## 2022-10-27 DIAGNOSIS — F419 Anxiety disorder, unspecified: Secondary | ICD-10-CM

## 2022-10-27 DIAGNOSIS — F339 Major depressive disorder, recurrent, unspecified: Secondary | ICD-10-CM | POA: Diagnosis not present

## 2022-10-27 DIAGNOSIS — F17220 Nicotine dependence, chewing tobacco, uncomplicated: Secondary | ICD-10-CM

## 2022-10-27 DIAGNOSIS — E7800 Pure hypercholesterolemia, unspecified: Secondary | ICD-10-CM

## 2022-10-27 MED ORDER — BUPROPION HCL ER (XL) 150 MG PO TB24: ORAL_TABLET | ORAL | 3 refills | Status: DC

## 2022-10-27 NOTE — Assessment & Plan Note (Signed)
Chronic, ongoing with PHQ9 = 11 and GAD7 = 4.  Continue Celexa 20 MG daily at this time and discussed with him options.  Will trial Wellbutrin XL 150 MG for 4 days and if tolerating increase to 300 MG daily, this may offer benefit to both anhedonia/motivation and concentration levels.  Educated on side effects and Black Box Warning -- aware to reach out immediately if SI presents or go to nearest ER.  Referral to psychiatry for ADD/ADHD testing placed in past, never attended -- could consider in future.  Return in 4 weeks for follow-up.

## 2022-10-27 NOTE — Assessment & Plan Note (Signed)
Quit over one year ago -- recommend continued cessation.

## 2022-10-27 NOTE — Assessment & Plan Note (Signed)
Refer to depression plan of care. 

## 2022-10-27 NOTE — Progress Notes (Signed)
BP 137/85   Pulse 78   Temp 98.2 F (36.8 C) (Oral)   Ht 6' 0.52" (1.842 m)   Wt 227 lb 3.2 oz (103.1 kg)   SpO2 98%   BMI 30.37 kg/m    Subjective:    Patient ID: Jay Hill, male    DOB: 1985/10/08, 37 y.o.   MRN: [id number]  HPI: Jay Hill is a 37 y.o. male presenting on 10/27/2022 for comprehensive medical examination. Current medical complaints include:none  He currently lives with: significant other Interim Problems from his last visit: no  Recent labs with [employer] where he works and in [employer], reviewed with patient. Iron level a little elevated, they are rechecking upcoming.  DEPRESSION Continues on Celexa 20 MG daily -- he feels this helps somewhat.  Former smoker, has not smoked in over one year.    Started taking Vitamin D week before last due to history of lows. Mood status: varies dependent on work stressors Satisfied with current treatment?: yes Symptom severity: moderate  Duration of current treatment : chronic Side effects: no Medication compliance: good compliance Psychotherapy/counseling: yes in the past Depressed mood: yes Anxious mood: yes Anhedonia: yes Significant weight loss or gain: no Insomnia: yes hard to fall asleep == takes Melatonin every other night Fatigue: yes Feelings of worthlessness or guilt:  sometimes Impaired concentration/indecisiveness: yes Suicidal ideations: no Hopelessness: no Crying spells: occasional    10/27/2022    1:55 PM 04/25/2022    3:46 PM 08/28/2021    4:02 PM 04/17/2021    2:48 PM 11/15/2020    4:03 PM  Depression screen PHQ 2/9  Decreased Interest '1 2 2 2 1  '$ Down, Depressed, Hopeless '2 2 1 2 1  '$ PHQ - 2 Score '3 4 3 4 2  '$ Altered sleeping '2 2 1 1 3  '$ Tired, decreased energy '3 3 3 3 3  '$ Change in appetite 0 0 0 1 0  Feeling bad or failure about yourself  0 0 1 2 0  Trouble concentrating '3 2 2 2 1  '$ Moving slowly or fidgety/restless 0 0 0 1 1  Suicidal thoughts 0 0 0 1 0  PHQ-9 Score '11 11 10 15 10   '$ Difficult doing work/chores Very difficult Very difficult Somewhat difficult Very difficult Somewhat difficult       10/27/2022    1:56 PM 04/25/2022    3:47 PM 08/28/2021    4:03 PM 04/17/2021    2:51 PM  GAD 7 : Generalized Anxiety Score  Nervous, Anxious, on Edge '1 1 1 2  '$ Control/stop worrying '1 1 1 2  '$ Worry too much - different things 1 0 1 3  Trouble relaxing '1 1 1 1  '$ Restless 0 0 1 2  Easily annoyed or irritable '1 1 1 3  '$ Afraid - awful might happen 1 0 0 2  Total GAD 7 Score '6 4 6 15  '$ Anxiety Difficulty Somewhat difficult Somewhat difficult Somewhat difficult Very difficult   Functional Status Survey: Is the patient deaf or have difficulty hearing?: No Does the patient have difficulty seeing, even when wearing glasses/contacts?: No Does the patient have difficulty concentrating, remembering, or making decisions?: No Does the patient have difficulty walking or climbing stairs?: No Does the patient have difficulty dressing or bathing?: No Does the patient have difficulty doing errands alone such as visiting a doctor's office or shopping?: No     11/15/2020    4:00 PM 04/25/2022    3:46 PM  Fall Risk  Falls in the past year? 0 0  Was there an injury with Fall? 0 0  Fall Risk Category Calculator 0 0  Fall Risk Category (Retired) Low Low  (RETIRED) Patient Fall Risk Level Low fall risk Low fall risk  Fall risk Follow up  Falls evaluation completed    Past Medical History:  Past Medical History:  Diagnosis Date   Allergy    Anxiety    Arthritis    Depression     Surgical History:  History reviewed. No pertinent surgical history.  Medications:  Current Outpatient Medications on File Prior to Visit  Medication Sig   Cholecalciferol 1.25 MG (50000 UT) TABS Take 1 tablet by mouth once a week for 12 doses.   citalopram (CELEXA) 20 MG tablet Take 1 tablet (20 mg total) by mouth daily.   Multiple Vitamin (MULTIVITAMIN) tablet Take 1 tablet by mouth daily.   No current  facility-administered medications on file prior to visit.    Allergies:  Allergies  Allergen Reactions   Pistachio Nut (Diagnostic) Swelling    Social History:  Social History   Socioeconomic History   Marital status: Married    Spouse name: Not on file   Number of children: Not on file   Years of education: Not on file   Highest education level: Not on file  Occupational History   Not on file  Tobacco Use   Smoking status: Some Days   Smokeless tobacco: Never   Tobacco comments:    Very rare occasion  Vaping Use   Vaping Use: Never used  Substance and Sexual Activity   Alcohol use: Yes    Alcohol/week: 2.0 standard drinks of alcohol    Types: 2 Cans of beer per week   Drug use: Yes    Types: Marijuana   Sexual activity: Yes  Other Topics Concern   Not on file  Social History Narrative   Not on file   Social Determinants of Health   Financial Resource Strain: Not on file  Food Insecurity: Not on file  Transportation Needs: Not on file  Physical Activity: Not on file  Stress: Not on file  Social Connections: Not on file  Intimate Partner Violence: Not on file   Social History   Tobacco Use  Smoking Status Some Days  Smokeless Tobacco Never  Tobacco Comments   Very rare occasion   Social History   Substance and Sexual Activity  Alcohol Use Yes   Alcohol/week: 2.0 standard drinks of alcohol   Types: 2 Cans of beer per week    Family History:  Family History  Problem Relation Age of Onset   Diabetes Mother    Kidney disease Mother    Thyroid disease Mother    Bell's palsy Father    Kidney disease Maternal Aunt    Kidney disease Maternal Grandmother    Hypertension Maternal Grandmother    Cancer Maternal Grandfather        stomach throat lung     Past medical history, surgical history, medications, allergies, family history and social history reviewed with patient today and changes made to appropriate areas of the chart.   ROS All other ROS  negative except what is listed above and in the HPI.      Objective:    BP 137/85   Pulse 78   Temp 98.2 F (36.8 C) (Oral)   Ht 6' 0.52" (1.842 m)   Wt 227 lb 3.2 oz (103.1 kg)   SpO2 98%  BMI 30.37 kg/m   Wt Readings from Last 3 Encounters:  10/27/22 227 lb 3.2 oz (103.1 kg)  10/21/22 221 lb 3.2 oz (100.3 kg)  10/15/22 218 lb (98.9 kg)    Physical Exam Vitals and nursing note reviewed.  Constitutional:      General: He is awake. He is not in acute distress.    Appearance: He is well-developed and well-groomed. He is not ill-appearing or toxic-appearing.  HENT:     Head: Normocephalic and atraumatic.     Right Ear: Hearing, tympanic membrane, ear canal and external ear normal. No drainage.     Left Ear: Hearing, tympanic membrane, ear canal and external ear normal. No drainage.     Nose: Nose normal.     Mouth/Throat:     Pharynx: Uvula midline.  Eyes:     General: Lids are normal.        Right eye: No discharge.        Left eye: No discharge.     Extraocular Movements: Extraocular movements intact.     Conjunctiva/sclera: Conjunctivae normal.     Pupils: Pupils are equal, round, and reactive to light.     Visual Fields: Right eye visual fields normal and left eye visual fields normal.  Neck:     Thyroid: No thyromegaly.     Vascular: No carotid bruit or JVD.     Trachea: Trachea normal.  Cardiovascular:     Rate and Rhythm: Normal rate and regular rhythm.     Heart sounds: Normal heart sounds, S1 normal and S2 normal. No murmur heard.    No gallop.  Pulmonary:     Effort: Pulmonary effort is normal. No accessory muscle usage or respiratory distress.     Breath sounds: Normal breath sounds.  Abdominal:     General: Bowel sounds are normal.     Palpations: Abdomen is soft. There is no hepatomegaly or splenomegaly.     Tenderness: There is no abdominal tenderness.  Musculoskeletal:        General: Normal range of motion.     Cervical back: Normal range of  motion and neck supple.     Right lower leg: No edema.     Left lower leg: No edema.  Lymphadenopathy:     Head:     Right side of head: No submental, submandibular, tonsillar, preauricular or posterior auricular adenopathy.     Left side of head: No submental, submandibular, tonsillar, preauricular or posterior auricular adenopathy.     Cervical: No cervical adenopathy.  Skin:    General: Skin is warm and dry.     Capillary Refill: Capillary refill takes less than 2 seconds.     Findings: No rash.  Neurological:     Mental Status: He is alert and oriented to person, place, and time.     Gait: Gait is intact.     Deep Tendon Reflexes: Reflexes are normal and symmetric.     Reflex Scores:      Brachioradialis reflexes are 2+ on the right side and 2+ on the left side.      Patellar reflexes are 2+ on the right side and 2+ on the left side. Psychiatric:        Attention and Perception: Attention normal.        Mood and Affect: Mood normal.        Speech: Speech normal.        Behavior: Behavior normal. Behavior is cooperative.  Thought Content: Thought content normal.        Cognition and Memory: Cognition normal.    Results for orders placed or performed in visit on 10/15/22  Executive Panel  Result Value Ref Range   Glucose 88 70 - 99 mg/dL   Uric Acid 7.8 3.8 - 8.4 mg/dL   BUN 14 6 - 20 mg/dL   Creatinine, Ser 1.04 0.76 - 1.27 mg/dL   eGFR 94 >59 mL/min/1.73   BUN/Creatinine Ratio 13 9 - 20   Sodium 141 134 - 144 mmol/L   Potassium 4.6 3.5 - 5.2 mmol/L   Chloride 101 96 - 106 mmol/L   Calcium 9.6 8.7 - 10.2 mg/dL   Phosphorus 3.9 2.8 - 4.1 mg/dL   Total Protein 7.3 6.0 - 8.5 g/dL   Albumin 5.1 4.1 - 5.1 g/dL   Globulin, Total 2.2 1.5 - 4.5 g/dL   Albumin/Globulin Ratio 2.3 (H) 1.2 - 2.2   Bilirubin Total 0.8 0.0 - 1.2 mg/dL   Alkaline Phosphatase 48 44 - 121 IU/L   LDH 161 121 - 224 IU/L   AST 14 0 - 40 IU/L   ALT 19 0 - 44 IU/L   GGT 18 0 - 65 IU/L   Iron  195 (H) 38 - 169 ug/dL   Cholesterol, Total 182 100 - 199 mg/dL   Triglycerides 114 0 - 149 mg/dL   HDL 43 >39 mg/dL   VLDL Cholesterol Cal 21 5 - 40 mg/dL   LDL Chol Calc (NIH) 118 (H) 0 - 99 mg/dL   Chol/HDL Ratio 4.2 0.0 - 5.0 ratio   Estimated CHD Risk 0.8 0.0 - 1.0 times avg.   TSH 1.100 0.450 - 4.500 uIU/mL   T4, Total 6.6 4.5 - 12.0 ug/dL   T3 Uptake Ratio 28 24 - 39 %   Free Thyroxine Index 1.8 1.2 - 4.9   WBC 5.7 3.4 - 10.8 x10E3/uL   RBC 5.00 4.14 - 5.80 x10E6/uL   Hemoglobin 15.2 13.0 - 17.7 g/dL   Hematocrit 45.9 37.5 - 51.0 %   MCV 92 79 - 97 fL   MCH 30.4 26.6 - 33.0 pg   MCHC 33.1 31.5 - 35.7 g/dL   RDW 12.2 11.6 - 15.4 %   Platelets 246 150 - 450 x10E3/uL   Neutrophils 58 Not Estab. %   Lymphs 26 Not Estab. %   Monocytes 10 Not Estab. %   Eos 5 Not Estab. %   Basos 1 Not Estab. %   Neutrophils Absolute 3.3 1.4 - 7.0 x10E3/uL   Lymphocytes Absolute 1.5 0.7 - 3.1 x10E3/uL   Monocytes Absolute 0.6 0.1 - 0.9 x10E3/uL   EOS (ABSOLUTE) 0.3 0.0 - 0.4 x10E3/uL   Basophils Absolute 0.1 0.0 - 0.2 x10E3/uL   Immature Granulocytes 0 Not Estab. %   Immature Grans (Abs) 0.0 0.0 - 0.1 x10E3/uL  VITAMIN D 25 Hydroxy (Vit-D Deficiency, Fractures)  Result Value Ref Range   Vit D, 25-Hydroxy 16.0 (L) 30.0 - 100.0 ng/mL  Hemoglobin A1c  Result Value Ref Range   Hgb A1c MFr Bld 5.3 4.8 - 5.6 %   Est. average glucose Bld gHb Est-mCnc 105 mg/dL      Assessment & Plan:   Problem List Items Addressed This Visit       Other   Anxiety    Refer to depression plan of care.      Relevant Medications   buPROPion (WELLBUTRIN XL) 150 MG 24 hr tablet   Depression,  recurrent ([place]) - Primary    Chronic, ongoing with PHQ9 = 11 and GAD7 = 4.  Continue Celexa 20 MG daily at this time and discussed with him options.  Will trial Wellbutrin XL 150 MG for 4 days and if tolerating increase to 300 MG daily, this may offer benefit to both anhedonia/motivation and concentration levels.   Educated on side effects and Black Box Warning -- aware to reach out immediately if SI presents or go to nearest ER.  Referral to psychiatry for ADD/ADHD testing placed in past, never attended -- could consider in future.  Return in 4 weeks for follow-up.      Relevant Medications   buPROPion (WELLBUTRIN XL) 150 MG 24 hr tablet   Elevated low density lipoprotein (LDL) cholesterol level    Recent labs via [facility] reviewed -- recommend continued focus on diet and regular exercise.      Former cigarette smoker    Quit over one year ago -- recommend continued cessation.      Impaired concentration    Refer to depression plan of care -- trial of Wellbutrin added today which may benefit concentration levels.      Other Visit Diagnoses     Vitamin D deficiency       History of low levels reported, check on labs today and start supplement as needed.   Encounter for annual physical exam       Annual physical today with labs and health maintenance reviewed, discussed with patient.       LABORATORY TESTING:  Health maintenance labs ordered today as discussed above.   IMMUNIZATIONS:   - Tdap: Tetanus vaccination status reviewed: last tetanus booster within 10 years. - Influenza: Up to date - Pneumovax: Not applicable - Prevnar: Not applicable - Zostavax vaccine: Not applicable  SCREENING: - Colonoscopy: Not applicable  Discussed with patient purpose of the colonoscopy is to detect colon cancer at curable precancerous or early stages   - AAA Screening: Not applicable  -Hearing Test: Not applicable  -Spirometry: Not applicable   PATIENT COUNSELING:    Sexuality: Discussed sexually transmitted diseases, partner selection, use of condoms, avoidance of unintended pregnancy  and contraceptive alternatives.   Advised to avoid cigarette smoking.  I discussed with the patient that most people either abstain from alcohol or drink within safe limits (<=14/week and <=4 drinks/occasion  for males, <=7/weeks and <= 3 drinks/occasion for females) and that the risk for alcohol disorders and other health effects rises proportionally with the number of drinks per week and how often a drinker exceeds daily limits.  Discussed cessation/primary prevention of drug use and availability of treatment for abuse.   Diet: Encouraged to adjust caloric intake to maintain  or achieve ideal body weight, to reduce intake of dietary saturated fat and total fat, to limit sodium intake by avoiding high sodium foods and not adding table salt, and to maintain adequate dietary potassium and calcium preferably from fresh fruits, vegetables, and low-fat dairy products.    Stressed the importance of regular exercise  Injury prevention: Discussed safety belts, safety helmets, smoke detector, smoking near bedding or upholstery.   Dental health: Discussed importance of regular tooth brushing, flossing, and dental visits.   Follow up plan: NEXT PREVENTATIVE PHYSICAL DUE IN 1 YEAR. Return in about 4 weeks (around 11/24/2022) for MOOD -- added on Wellbutrin -- can be virtual.

## 2022-10-27 NOTE — Assessment & Plan Note (Signed)
Refer to depression plan of care -- trial of Wellbutrin added today which may benefit concentration levels.

## 2022-10-27 NOTE — Assessment & Plan Note (Signed)
Recent labs via [facility] reviewed -- recommend continued focus on diet and regular exercise.

## 2022-10-29 NOTE — Progress Notes (Signed)
noted 

## 2022-11-18 ENCOUNTER — Other Ambulatory Visit: Payer: Self-pay | Admitting: Nurse Practitioner

## 2022-11-18 NOTE — Telephone Encounter (Signed)
Requested Prescriptions  Pending Prescriptions Disp Refills   buPROPion (WELLBUTRIN XL) 150 MG 24 hr tablet [Pharmacy Med Name: BUPROPION HCL XL 150 MG TABLET] 180 tablet 0    Sig: START OUT TAKING 150 MG (ONE TABLET) BY MOUTH ONCE DAILY IN THE MORNING IF TOLERATED, MAY INCREASE ON DAY 4 OF DOSING TO 300 MG (TWO TABLETS) BY MOUTH ONCE DAILY.     Psychiatry: Antidepressants - bupropion Passed - 11/18/2022  9:32 AM      Passed - Cr in normal range and within 360 days    Creatinine, Ser  Date Value Ref Range Status  10/15/2022 1.04 0.76 - 1.27 mg/dL Final         Passed - AST in normal range and within 360 days    AST  Date Value Ref Range Status  10/15/2022 14 0 - 40 IU/L Final         Passed - ALT in normal range and within 360 days    ALT  Date Value Ref Range Status  10/15/2022 19 0 - 44 IU/L Final         Passed - Completed PHQ-2 or PHQ-9 in the last 360 days      Passed - Last BP in normal range    BP Readings from Last 1 Encounters:  10/27/22 137/85         Passed - Valid encounter within last 6 months    Recent Outpatient Visits           3 weeks ago Depression, recurrent ([place])   [address] [place], Wann T, NP   6 months ago Depression, recurrent ([place])   [place], Henrine Screws T, NP   1 year ago Depression, recurrent ([place])   [place], Henrine Screws T, NP   1 year ago Depression, recurrent ([place])   [place], Henrine Screws T, NP   2 years ago Encounter to establish care   [place] Charyl Dancer, NP       Future Appointments             In 1 week [place], Barbaraann Faster, NP [place]

## 2022-11-26 ENCOUNTER — Telehealth: Payer: No Typology Code available for payment source | Admitting: Nurse Practitioner

## 2022-12-14 NOTE — Patient Instructions (Signed)
Managing Depression, Adult Depression is a mental health condition that affects your thoughts, feelings, and actions. Being diagnosed with depression can bring you relief if you did not know why you have felt or behaved a certain way. It could also leave you feeling overwhelmed. Finding ways to manage your symptoms can help you feel more positive about your future. How to manage lifestyle changes Being depressed is difficult. Depression can increase the level of everyday stress. Stress can make depression symptoms worse. You may believe your symptoms cannot be managed or will never improve. However, there are many things you can try to help manage your symptoms. There is hope. Managing stress  Stress is your body's reaction to life changes and events, both good and bad. Stress can add to your feelings of depression. Learning to manage your stress can help lessen your feelings of depression. Try some of the following approaches to reducing your stress (stress reduction techniques): Listen to music that you enjoy and that inspires you. Try using a meditation app or take a meditation class. Develop a practice that helps you connect with your spiritual self. Walk in nature, pray, or go to a place of worship. Practice deep breathing. To do this, inhale slowly through your nose. Pause at the top of your inhale for a few seconds and then exhale slowly, letting yourself relax. Repeat this three or four times. Practice yoga to help relax and work your muscles. Choose a stress reduction technique that works for you. These techniques take time and practice to develop. Set aside 5-15 minutes a day to do them. Therapists can offer training in these techniques. Do these things to help manage stress: Keep a journal. Know your limits. Set healthy boundaries for yourself and others, such as saying "no" when you think something is too much. Pay attention to how you react to certain situations. You may not be able to  control everything, but you can change your reaction. Add humor to your life by watching funny movies or shows. Make time for activities that you enjoy and that relax you. Spend less time using electronics, especially at night before bed. The light from screens can make your brain think it is time to get up rather than go to bed.  Medicines Medicines, such as antidepressants, are often a part of treatment for depression. Talk with your pharmacist or health care provider about all the medicines, supplements, and herbal products that you take, their possible side effects, and what medicines and other products are safe to take together. Make sure to report any side effects you may have to your health care provider. Relationships Your health care provider may suggest family therapy, couples therapy, or individual therapy as part of your treatment. How to recognize changes Everyone responds differently to treatment for depression. As you recover from depression, you may start to: Have more interest in doing activities. Feel more hopeful. Have more energy. Eat a more regular amount of food. Have better mental focus. It is important to recognize if your depression is not getting better or is getting worse. The symptoms you had in the beginning may return, such as: Feeling tired. Eating too much or too little. Sleeping too much or too little. Feeling restless, agitated, or hopeless. Trouble focusing or making decisions. Having unexplained aches and pains. Feeling irritable, angry, or aggressive. If you or your family members notice these symptoms coming back, let your health care provider know right away. Follow these instructions at home: Activity Try to   get some form of exercise each day, such as walking. Try yoga, mindfulness, or other stress reduction techniques. Participate in group activities if you are able. Lifestyle Get enough sleep. Cut down on or stop using caffeine, tobacco,  alcohol, and any other harmful substances. Eat a healthy diet that includes plenty of vegetables, fruits, whole grains, low-fat dairy products, and lean protein. Limit foods that are high in solid fats, added sugar, or salt (sodium). General instructions Take over-the-counter and prescription medicines only as told by your health care provider. Keep all follow-up visits. It is important for your health care provider to check on your mood, behavior, and medicines. Your health care provider may need to make changes to your treatment. Where to find support Talking to others  Friends and family members can be sources of support and guidance. Talk to trusted friends or family members about your condition. Explain your symptoms and let them know that you are working with a health care provider to treat your depression. Tell friends and family how they can help. Finances Find mental health providers that fit with your financial situation. Talk with your health care provider if you are worried about access to food, housing, or medicine. Call your insurance company to learn about your co-pays and prescription plan. Where to find more information You can find support in your area from: Anxiety and Depression Association of America (ADAA): adaa.org Mental Health America: mentalhealthamerica.net National Alliance on Mental Illness: nami.org Contact a health care provider if: You stop taking your antidepressant medicines, and you have any of these symptoms: Nausea. Headache. Light-headedness. Chills and body aches. Not being able to sleep (insomnia). You or your friends and family think your depression is getting worse. Get help right away if: You have thoughts of hurting yourself or others. Get help right away if you feel like you may hurt yourself or others, or have thoughts about taking your own life. Go to your nearest emergency room or: Call 911. Call the National Suicide Prevention Lifeline at  1-800-273-8255 or 988. This is open 24 hours a day. Text the Crisis Text Line at 741741. This information is not intended to replace advice given to you by your health care provider. Make sure you discuss any questions you have with your health care provider. Document Revised: 12/10/2021 Document Reviewed: 12/10/2021 Elsevier Patient Education  2023 [employer]vier Inc.  

## 2022-12-15 ENCOUNTER — Telehealth (INDEPENDENT_AMBULATORY_CARE_PROVIDER_SITE_OTHER): Payer: No Typology Code available for payment source | Admitting: Nurse Practitioner

## 2022-12-15 ENCOUNTER — Encounter: Payer: Self-pay | Admitting: Nurse Practitioner

## 2022-12-15 DIAGNOSIS — F419 Anxiety disorder, unspecified: Secondary | ICD-10-CM

## 2022-12-15 DIAGNOSIS — F339 Major depressive disorder, recurrent, unspecified: Secondary | ICD-10-CM | POA: Diagnosis not present

## 2022-12-15 MED ORDER — BUPROPION HCL ER (XL) 300 MG PO TB24: 300.0000 mg | ORAL_TABLET | Freq: Every day | ORAL | 6 refills | Status: DC

## 2022-12-15 NOTE — Assessment & Plan Note (Addendum)
Chronic, ongoing with PHQ9 = 14 and GAD7 = 8.  Denies SI/HI.  Continue Celexa 20 MG daily at this time and discussed with him options.  Will increase Wellbutrin XL to 300 MG daily, this may offer more benefit to both anhedonia/motivation and concentration levels.  Educated on side effects and Black Box Warning -- aware to reach out immediately if SI presents or go to nearest ER.  Referral to psychiatry for ADD/ADHD testing placed in past, never attended -- could consider in future.  Return in 6 weeks for follow-up.

## 2022-12-15 NOTE — Progress Notes (Signed)
Appointment has been scheduled.

## 2022-12-15 NOTE — Progress Notes (Signed)
There were no vitals taken for this visit.   Subjective:    Patient ID: Jay Hill, male    DOB: 01-Mar-1986, 37 y.o.   MRN: [id number]  HPI: Jay Hill is a 37 y.o. male  Chief Complaint  Patient presents with   Mood    Added Wellbutrin at last visit. Per patient medications seems to be working rather well.    This visit was completed via video visit through MyChart due to the restrictions of the COVID-19 pandemic. All issues as above were discussed and addressed. Physical exam was done as above through visual confirmation on video through MyChart. If it was felt that the patient should be evaluated in the office, they were directed there. The patient verbally consented to this visit. Location of the patient: home Location of the provider: work Those involved with this call:  Provider: Aura Dials, DNP CMA: Jay Hill, CMA Front Desk/Registration: Jay Hill  Time spent on call:  21 minutes with patient face to face via video conference. More than 50% of this time was spent in counseling and coordination of care. 15 minutes total spent in review of patient's record and preparation of their chart.  I verified patient identity using two factors (patient name and date of birth). Patient consents verbally to being seen via telemedicine visit today.    DEPRESSION Follow-up today for addition of Wellbutrin on 10/27/22 to aid in anhedonia.  Continues on Celexa 20 MG daily.  Former smoker, has not smoked in over one year. Currently taking Wellbutrin XL 150 MG daily.  Within first week he did notice improvement, which has tapered.  Is having occasional headaches, has not slept very well recently. Mood status: stable Satisfied with current treatment?: yes Symptom severity: moderate  Duration of current treatment : chronic Side effects: no Medication compliance: good compliance Psychotherapy/counseling: yes in the past Previous psychiatric medications:  Depressed mood:  stable Anxious mood:  at baseline Anhedonia: improving Significant weight loss or gain: no Insomnia: yes hard to stay asleep Fatigue: yes Feelings of worthlessness or guilt: no Impaired concentration/indecisiveness: improving Suicidal ideations: no - none recent Hopelessness: no Crying spells: no    12/15/2022   11:04 AM 10/27/2022    1:55 PM 04/25/2022    3:46 PM 08/28/2021    4:02 PM 04/17/2021    2:48 PM  Depression screen PHQ 2/9  Decreased Interest 2 1 2 2 2   Down, Depressed, Hopeless 2 2 2 1 2   PHQ - 2 Score 4 3 4 3 4   Altered sleeping 3 2 2 1 1   Tired, decreased energy 2 3 3 3 3   Change in appetite 1 0 0 0 1  Feeling bad or failure about yourself  1 0 0 1 2  Trouble concentrating 2 3 2 2 2   Moving slowly or fidgety/restless 0 0 0 0 1  Suicidal thoughts 1 0 0 0 1  PHQ-9 Score 14 11 11 10 15   Difficult doing work/chores Somewhat difficult Very difficult Very difficult Somewhat difficult Very difficult       12/15/2022   11:06 AM 10/27/2022    1:56 PM 04/25/2022    3:47 PM 08/28/2021    4:03 PM  GAD 7 : Generalized Anxiety Score  Nervous, Anxious, on Edge 2 1 1 1   Control/stop worrying 1 1 1 1   Worry too much - different things 1 1 0 1  Trouble relaxing 1 1 1 1   Restless 1 0 0 1  Easily annoyed or  irritable 1 1 1 1   Afraid - awful might happen 1 1 0 0  Total GAD 7 Score 8 6 4 6   Anxiety Difficulty Not difficult at all Somewhat difficult Somewhat difficult Somewhat difficult      Relevant past medical, surgical, family and social history reviewed and updated as indicated. Interim medical history since our last visit reviewed. Allergies and medications reviewed and updated.  Review of Systems  Constitutional:  Negative for activity change, diaphoresis, fatigue and fever.  Respiratory:  Negative for cough, chest tightness, shortness of breath and wheezing.   Cardiovascular:  Negative for chest pain, palpitations and leg swelling.  Neurological: Negative.    Psychiatric/Behavioral:  Positive for sleep disturbance. Negative for decreased concentration, self-injury and suicidal ideas. The patient is nervous/anxious.     Per HPI unless specifically indicated above     Objective:    There were no vitals taken for this visit.  Wt Readings from Last 3 Encounters:  10/27/22 227 lb 3.2 oz (103.1 kg)  10/21/22 221 lb 3.2 oz (100.3 kg)  10/15/22 218 lb (98.9 kg)    Physical Exam Vitals and nursing note reviewed.  Constitutional:      General: He is awake. He is not in acute distress.    Appearance: He is well-developed. He is not ill-appearing.  HENT:     Head: Normocephalic.     Right Ear: Hearing normal. No drainage.     Left Ear: Hearing normal. No drainage.  Eyes:     General: Lids are normal.        Right eye: No discharge.        Left eye: No discharge.     Conjunctiva/sclera: Conjunctivae normal.  Pulmonary:     Effort: Pulmonary effort is normal. No accessory muscle usage or respiratory distress.  Musculoskeletal:     Cervical back: Normal range of motion.  Neurological:     Mental Status: He is alert and oriented to person, place, and time.  Psychiatric:        Mood and Affect: Mood normal.        Behavior: Behavior normal. Behavior is cooperative.        Thought Content: Thought content normal.        Judgment: Judgment normal.     Results for orders placed or performed in visit on 10/15/22  Executive Panel  Result Value Ref Range   Glucose 88 70 - 99 mg/dL   Uric Acid 7.8 3.8 - 8.4 mg/dL   BUN 14 6 - 20 mg/dL   Creatinine, Ser 1.61 0.76 - 1.27 mg/dL   eGFR 94 >09 UE/AVW/0.98   BUN/Creatinine Ratio 13 9 - 20   Sodium 141 134 - 144 mmol/L   Potassium 4.6 3.5 - 5.2 mmol/L   Chloride 101 96 - 106 mmol/L   Calcium 9.6 8.7 - 10.2 mg/dL   Phosphorus 3.9 2.8 - 4.1 mg/dL   Total Protein 7.3 6.0 - 8.5 g/dL   Albumin 5.1 4.1 - 5.1 g/dL   Globulin, Total 2.2 1.5 - 4.5 g/dL   Albumin/Globulin Ratio 2.3 (H) 1.2 - 2.2    Bilirubin Total 0.8 0.0 - 1.2 mg/dL   Alkaline Phosphatase 48 44 - 121 IU/L   LDH 161 121 - 224 IU/L   AST 14 0 - 40 IU/L   ALT 19 0 - 44 IU/L   GGT 18 0 - 65 IU/L   Iron 195 (H) 38 - 169 ug/dL   Cholesterol, Total 119  100 - 199 mg/dL   Triglycerides 409 0 - 149 mg/dL   HDL 43 >81 mg/dL   VLDL Cholesterol Cal 21 5 - 40 mg/dL   LDL Chol Calc (NIH) 191 (H) 0 - 99 mg/dL   Chol/HDL Ratio 4.2 0.0 - 5.0 ratio   Estimated CHD Risk 0.8 0.0 - 1.0 times avg.   TSH 1.100 0.450 - 4.500 uIU/mL   T4, Total 6.6 4.5 - 12.0 ug/dL   T3 Uptake Ratio 28 24 - 39 %   Free Thyroxine Index 1.8 1.2 - 4.9   WBC 5.7 3.4 - 10.8 x10E3/uL   RBC 5.00 4.14 - 5.80 x10E6/uL   Hemoglobin 15.2 13.0 - 17.7 g/dL   Hematocrit 47.8 29.5 - 51.0 %   MCV 92 79 - 97 fL   MCH 30.4 26.6 - 33.0 pg   MCHC 33.1 31.5 - 35.7 g/dL   RDW 62.1 30.8 - 65.7 %   Platelets 246 150 - 450 x10E3/uL   Neutrophils 58 Not Estab. %   Lymphs 26 Not Estab. %   Monocytes 10 Not Estab. %   Eos 5 Not Estab. %   Basos 1 Not Estab. %   Neutrophils Absolute 3.3 1.4 - 7.0 x10E3/uL   Lymphocytes Absolute 1.5 0.7 - 3.1 x10E3/uL   Monocytes Absolute 0.6 0.1 - 0.9 x10E3/uL   EOS (ABSOLUTE) 0.3 0.0 - 0.4 x10E3/uL   Basophils Absolute 0.1 0.0 - 0.2 x10E3/uL   Immature Granulocytes 0 Not Estab. %   Immature Grans (Abs) 0.0 0.0 - 0.1 x10E3/uL  VITAMIN D 25 Hydroxy (Vit-D Deficiency, Fractures)  Result Value Ref Range   Vit D, 25-Hydroxy 16.0 (L) 30.0 - 100.0 ng/mL  Hemoglobin A1c  Result Value Ref Range   Hgb A1c MFr Bld 5.3 4.8 - 5.6 %   Est. average glucose Bld gHb Est-mCnc 105 mg/dL      Assessment & Plan:   Problem List Items Addressed This Visit       Other   Anxiety    Refer to depression plan of care.      Relevant Medications   buPROPion (WELLBUTRIN XL) 300 MG 24 hr tablet   Depression, recurrent (HCC) - Primary    Chronic, ongoing with PHQ9 = 14 and GAD7 = 8.  Denies SI/HI.  Continue Celexa 20 MG daily at this time and  discussed with him options.  Will increase Wellbutrin XL to 300 MG daily, this may offer more benefit to both anhedonia/motivation and concentration levels.  Educated on side effects and Black Box Warning -- aware to reach out immediately if SI presents or go to nearest ER.  Referral to psychiatry for ADD/ADHD testing placed in past, never attended -- could consider in future.  Return in 6 weeks for follow-up.      Relevant Medications   buPROPion (WELLBUTRIN XL) 300 MG 24 hr tablet    I discussed the assessment and treatment plan with the patient. The patient was provided an opportunity to ask questions and all were answered. The patient agreed with the plan and demonstrated an understanding of the instructions.   The patient was advised to call back or seek an in-person evaluation if the symptoms worsen or if the condition fails to improve as anticipated.   I provided 21+ minutes of time during this encounter.    Follow up plan: Return in about 6 weeks (around 01/26/2023) for Depression -- increased Wellbutrin to 300 MG.

## 2022-12-15 NOTE — Assessment & Plan Note (Signed)
Refer to depression plan of care. 

## 2022-12-25 ENCOUNTER — Ambulatory Visit: Payer: Self-pay | Admitting: Registered Nurse

## 2022-12-25 ENCOUNTER — Encounter: Payer: Self-pay | Admitting: Registered Nurse

## 2022-12-25 DIAGNOSIS — R519 Headache, unspecified: Secondary | ICD-10-CM

## 2022-12-25 MED ORDER — IBUPROFEN 800 MG PO TABS: 800.0000 mg | ORAL_TABLET | Freq: Three times a day (TID) | ORAL | 0 refills | Status: DC
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
## 2022-12-25 NOTE — Patient Instructions (Signed)
Sinus Pain  Sinus pain may occur when your sinuses become clogged or swollen. Sinuses are air-filled spaces in your skull that are behind the bones of your face and forehead. Sinus pain can range from mild to severe. What are the causes? Sinus pain can result from various conditions that affect the sinuses. Common causes include: Colds. Sinus infections. Allergies. What are the signs or symptoms? The main symptom of this condition is pain or pressure in your face, forehead, ears, or upper teeth. People who have sinus pain often have other symptoms, such as: Congested or runny nose. Fever. Inability to smell. Headache. Weather changes can make symptoms worse. How is this diagnosed? Your health care provider will diagnose this condition based on your symptoms and a physical exam. If you have pain that keeps coming back or does not go away, your health care provider may recommend more testing. This may include: Imaging tests, such as a CT scan or MRI, to check for problems with your sinuses. Examination of your sinuses using a thin tool with a camera that is inserted through your nose (endoscopy). How is this treated? Treatment for this condition depends on the cause. Sinus pain that is caused by a sinus infection may be treated with antibiotic medicine. Sinus pain that is caused by congestion may be helped by rinsing out (flushing) the nose and sinuses with saline solution. Sinus pain that is caused by allergies may be helped by allergy medicines (antihistamines) and medicated nasal sprays. Sinus surgery may be needed in some cases if other treatments do not help. Follow these instructions at home: General instructions If directed: Apply a warm, moist washcloth to your face to help relieve pain. Use a nasal saline wash. Follow the directions on the bottle or box. Hydrate and humidify Drink enough water to keep your urine clear or pale yellow. Staying hydrated will help to thin your  mucus. Use a humidifier if your home is dry. Inhale steam for 10-15 minutes, 3-4 times a day or as told by your health care provider. You can do this in the bathroom while a hot shower is running. Limit your exposure to cool or dry air. Medicines  Take over-the-counter and prescription medicines only as told by your health care provider. If you were prescribed an antibiotic medicine, take it as told by your health care provider. Do not stop taking the antibiotic even if you start to feel better. If you have congestion, use a nasal spray to help lessen pressure. Contact a health care provider if: You have sinus pain more than one time a week. You have sensitivity to light or sound. You develop a fever. You feel nauseous or you vomit. Your sinus pain or headache does not get better with treatment. Get help right away if: You have vision problems. You have sudden, severe pain in your face or head. You have a seizure. You are confused. You have a stiff neck. Summary Sinus pain occurs when your sinuses become clogged or swollen. Sinus pain can result from various conditions that affect the sinuses, such as a cold, a sinus infection, or an allergy. Treatment for this condition depends on the cause. It may include medicine, such as antibiotics or antihistamines. This information is not intended to replace advice given to you by your health care provider. Make sure you discuss any questions you have with your health care provider. Document Revised: 07/07/2021 Document Reviewed: 07/07/2021 Elsevier Patient Education  2023 Elsevier Inc.  

## 2022-12-31 ENCOUNTER — Other Ambulatory Visit: Payer: Self-pay | Admitting: Registered Nurse

## 2022-12-31 ENCOUNTER — Encounter: Payer: Self-pay | Admitting: Occupational Medicine

## 2022-12-31 DIAGNOSIS — E559 Vitamin D deficiency, unspecified: Secondary | ICD-10-CM

## 2023-01-02 ENCOUNTER — Encounter: Payer: Self-pay | Admitting: Registered Nurse

## 2023-01-02 NOTE — Telephone Encounter (Signed)
Refilled cholecalciferol 50,000 units po weekly #12 RF0 take with meal.  Patient has 01/21/23 lab draw scheduled with RN Bess Kinds to determine current blood level and future dosing vitamin D     Component Ref Range & Units 2 mo ago  Vit D, 25-Hydroxy 30.0 - 100.0 ng/mL 16.0 Low   Comment: Vitamin D deficiency has been defined by the Institute of Medicine and an Endocrine Society practice guideline as a level of serum 25-OH vitamin D less than 20 ng/mL (1,2). The Endocrine Society went on to further define vitamin D insufficiency as a level between 21 and 29 ng/mL (2). 1. IOM (Institute of Medicine). 2010. Dietary reference    intakes for calcium and D. [place]: The    [employer]. 2. Holick MF, [place], Bischoff-Ferrari HA, et al.    Evaluation, treatment, and prevention of vitamin D    deficiency: an Endocrine Society clinical practice    guideline. JCEM. 2011 Jul; 96(7):1911-30.  Resulting Agency [facility]         Narrative Performed by: Verdell Carmine Performed at:  [address] [place] [address], [place], [place]  [id number] Lab Director: Jolene Schimke MD, Phone:  [phone]    Specimen Collected: 10/15/22 08:45

## 2023-01-10 ENCOUNTER — Other Ambulatory Visit: Payer: Self-pay | Admitting: Nurse Practitioner

## 2023-01-13 NOTE — Telephone Encounter (Signed)
Requested medications are due for refill today.  no  Requested medications are on the active medications list.  yes  Last refill. 12/15/2022 #30 6 rf  Future visit scheduled.   yes  Notes to clinic.  Requesting 90 day supply.    Requested Prescriptions  Pending Prescriptions Disp Refills   buPROPion (WELLBUTRIN XL) 300 MG 24 hr tablet [Pharmacy Med Name: BUPROPION HCL XL 300 MG TABLET] 90 tablet 3    Sig: TAKE 1 TABLET BY MOUTH EVERY DAY     Psychiatry: Antidepressants - bupropion Passed - 01/10/2023 11:30 AM      Passed - Cr in normal range and within 360 days    Creatinine, Ser  Date Value Ref Range Status  10/15/2022 1.04 0.76 - 1.27 mg/dL Final         Passed - AST in normal range and within 360 days    AST  Date Value Ref Range Status  10/15/2022 14 0 - 40 IU/L Final         Passed - ALT in normal range and within 360 days    ALT  Date Value Ref Range Status  10/15/2022 19 0 - 44 IU/L Final         Passed - Completed PHQ-2 or PHQ-9 in the last 360 days      Passed - Last BP in normal range    BP Readings from Last 1 Encounters:  10/27/22 137/85         Passed - Valid encounter within last 6 months    Recent Outpatient Visits           4 weeks ago Depression, recurrent (HCC)   [place] [facility] [place], [place] T, NP   2 months ago Depression, recurrent (HCC)   [place] [facility] [place], Corrie Dandy T, NP   8 months ago Depression, recurrent (HCC)   [place] [facility] [place], Corrie Dandy T, NP   1 year ago Depression, recurrent (HCC)   [place] [facility] [place], Corrie Dandy T, NP   1 year ago Depression, recurrent (HCC)   [place] [facility] [place], Dorie Rank, NP       Future Appointments             In 1 week [place], Dorie Rank, NP [facility]

## 2023-01-21 ENCOUNTER — Other Ambulatory Visit: Payer: Self-pay | Admitting: Occupational Medicine

## 2023-01-21 DIAGNOSIS — E559 Vitamin D deficiency, unspecified: Secondary | ICD-10-CM

## 2023-01-21 NOTE — Progress Notes (Signed)
Lab drawn from Left AC tolerated well no issues noted.   

## 2023-01-22 ENCOUNTER — Other Ambulatory Visit: Payer: Self-pay | Admitting: Registered Nurse

## 2023-01-22 DIAGNOSIS — Z8639 Personal history of other endocrine, nutritional and metabolic disease: Secondary | ICD-10-CM

## 2023-01-22 LAB — IRON: Iron: 123 ug/dL (ref 38–169)

## 2023-01-22 LAB — VITAMIN D 25 HYDROXY (VIT D DEFICIENCY, FRACTURES): Vit D, 25-Hydroxy: 78.5 ng/mL (ref 30.0–100.0)

## 2023-01-22 MED ORDER — VITAMIN D3 25 MCG (1000 UT) PO CAPS: 1000.0000 [IU] | ORAL_CAPSULE | Freq: Every day | ORAL | 3 refills | Status: DC

## 2023-01-26 ENCOUNTER — Ambulatory Visit: Payer: No Typology Code available for payment source | Admitting: Nurse Practitioner

## 2023-02-01 NOTE — Progress Notes (Signed)
noted 

## 2023-03-09 NOTE — Progress Notes (Signed)
Patient had follow up labs 01/21/23 iron normal taking multivitamin.  Noted RN Kimrey follow up replies with patient regarding iron intake and has been on herbal dietary supplement also.

## 2023-04-07 ENCOUNTER — Telehealth: Payer: Self-pay

## 2023-04-07 NOTE — Transitions of Care (Post Inpatient/ED Visit) (Unsigned)
   04/07/2023  Name: Jay Hill MRN: [id number] DOB: July 17, 1986  Today's TOC FU Call Status: Today's TOC FU Call Status:: Unsuccessful Call (1st Attempt) Unsuccessful Call (1st Attempt) Date: 04/07/23  Attempted to reach the patient regarding the most recent Inpatient/ED visit.  Follow Up Plan: Additional outreach attempts will be made to reach the patient to complete the Transitions of Care (Post Inpatient/ED visit) call.   Signature: Wilhemena Durie, CMA

## 2023-04-08 NOTE — Transitions of Care (Post Inpatient/ED Visit) (Unsigned)
   04/08/2023  Name: Jay Hill MRN: [id number] DOB: 12-06-1985  Today's TOC FU Call Status: Today's TOC FU Call Status:: Unsuccessful Call (2nd Attempt) Unsuccessful Call (1st Attempt) Date: 04/07/23 Unsuccessful Call (2nd Attempt) Date: 04/08/23  Attempted to reach the patient regarding the most recent Inpatient/ED visit.  Follow Up Plan: Additional outreach attempts will be made to reach the patient to complete the Transitions of Care (Post Inpatient/ED visit) call.   Signature: Wilhemena Durie, CMA

## 2023-04-09 NOTE — Transitions of Care (Post Inpatient/ED Visit) (Signed)
   04/09/2023  Name: Jay Hill MRN: [id number] DOB: [birth year]  Today's TOC FU Call Status: Today's TOC FU Call Status:: Unsuccessful Call (3rd Attempt) Unsuccessful Call (1st Attempt) Date: 04/07/23 Unsuccessful Call (2nd Attempt) Date: 04/08/23 Unsuccessful Call (3rd Attempt) Date: 04/09/23  Attempted to reach the patient regarding the most recent Inpatient/ED visit.  Follow Up Plan: No further outreach attempts will be made at this time. We have been unable to contact the patient.  Signature: Wilhemena Durie, CMA

## 2023-06-08 ENCOUNTER — Ambulatory Visit: Payer: Self-pay

## 2023-06-11 ENCOUNTER — Ambulatory Visit: Payer: Self-pay

## 2023-06-11 DIAGNOSIS — Z23 Encounter for immunization: Secondary | ICD-10-CM

## 2023-07-25 ENCOUNTER — Other Ambulatory Visit: Payer: Self-pay | Admitting: Nurse Practitioner

## 2023-07-27 NOTE — Telephone Encounter (Signed)
Last OV in April- missed f/u and had some canceled appts. Called pt and LM on VM to call office to schedule f/u Since < 3 month overdue will give 30 day courtesy refill only.   Requested Prescriptions  Pending Prescriptions Disp Refills   citalopram (CELEXA) 20 MG tablet [Pharmacy Med Name: CITALOPRAM HBR 20 MG TABLET] 90 tablet 4    Sig: TAKE 1 TABLET BY MOUTH EVERY DAY     Psychiatry:  Antidepressants - SSRI Failed - 07/25/2023  1:07 AM      Failed - Valid encounter within last 6 months    Recent Outpatient Visits           7 months ago Depression, recurrent (HCC)   [place] [facility] [place], Corrie Dandy T, NP   9 months ago Depression, recurrent (HCC)   [place] [facility] [place], Corrie Dandy T, NP   1 year ago Depression, recurrent (HCC)   [place] [facility] [place], Corrie Dandy T, NP   1 year ago Depression, recurrent (HCC)   [place] [facility] [place], Corrie Dandy T, NP   2 years ago Depression, recurrent (HCC)    [facility] [place], Noel T, NP              Passed - Completed PHQ-2 or PHQ-9 in the last 360 days

## 2023-08-29 ENCOUNTER — Other Ambulatory Visit: Payer: Self-pay | Admitting: Nurse Practitioner

## 2023-09-01 ENCOUNTER — Encounter: Payer: Self-pay | Admitting: *Deleted

## 2023-09-01 NOTE — Telephone Encounter (Signed)
 Needs appointment

## 2023-09-01 NOTE — Telephone Encounter (Signed)
 Patient is overdue for an appointment. Please call to schedule and then route to provider for refill.

## 2023-09-01 NOTE — Telephone Encounter (Signed)
 Requested medication (s) are due for refill today:   Yes  Requested medication (s) are on the active medication list:   Yes  Future visit scheduled:   No.    Sent him a MyChart message that it was time to schedule an appt. With Jolene in order to continue refilling his medication.   Courtesy supply for 30 days was given on 07/27/2023.   Last ordered: 07/27/2023 #30, 0 refills  Unable to refill per protocol due to 6 month appt needed.     Requested Prescriptions  Pending Prescriptions Disp Refills   citalopram  (CELEXA ) 20 MG tablet [Pharmacy Med Name: CITALOPRAM  HBR 20 MG TABLET] 90 tablet 1    Sig: TAKE 1 TABLET BY MOUTH EVERY DAY     Psychiatry:  Antidepressants - SSRI Failed - 09/01/2023  8:50 AM      Failed - Valid encounter within last 6 months    Recent Outpatient Visits           8 months ago Depression, recurrent (HCC)   [place] [facility] [place], Melanie T, NP   10 months ago Depression, recurrent (HCC)   [place] [facility] [place], Melanie T, NP   1 year ago Depression, recurrent (HCC)   [place] [facility] [place], Melanie T, NP   2 years ago Depression, recurrent (HCC)   [place] [facility] [place], Melanie T, NP   2 years ago Depression, recurrent (HCC)    [facility] [place], [place] T, NP              Passed - Completed PHQ-2 or PHQ-9 in the last 360 days

## 2023-09-13 NOTE — Patient Instructions (Signed)
Managing Depression, Adult Depression is a mental health condition that affects your thoughts, feelings, and actions. Being diagnosed with depression can bring you relief if you did not know why you have felt or behaved a certain way. It could also leave you feeling overwhelmed. Finding ways to manage your symptoms can help you feel more positive about your future. How to manage lifestyle changes Being depressed is difficult. Depression can increase the level of everyday stress. Stress can make depression symptoms worse. You may believe your symptoms cannot be managed or will never improve. However, there are many things you can try to help manage your symptoms. There is hope. Managing stress  Stress is your body's reaction to life changes and events, both good and bad. Stress can add to your feelings of depression. Learning to manage your stress can help lessen your feelings of depression. Try some of the following approaches to reducing your stress (stress reduction techniques): Listen to music that you enjoy and that inspires you. Try using a meditation app or take a meditation class. Develop a practice that helps you connect with your spiritual self. Walk in nature, pray, or go to a place of worship. Practice deep breathing. To do this, inhale slowly through your nose. Pause at the top of your inhale for a few seconds and then exhale slowly, letting yourself relax. Repeat this three or four times. Practice yoga to help relax and work your muscles. Choose a stress reduction technique that works for you. These techniques take time and practice to develop. Set aside 5-15 minutes a day to do them. Therapists can offer training in these techniques. Do these things to help manage stress: Keep a journal. Know your limits. Set healthy boundaries for yourself and others, such as saying "no" when you think something is too much. Pay attention to how you react to certain situations. You may not be able to  control everything, but you can change your reaction. Add humor to your life by watching funny movies or shows. Make time for activities that you enjoy and that relax you. Spend less time using electronics, especially at night before bed. The light from screens can make your brain think it is time to get up rather than go to bed.  Medicines Medicines, such as antidepressants, are often a part of treatment for depression. Talk with your pharmacist or health care provider about all the medicines, supplements, and herbal products that you take, their possible side effects, and what medicines and other products are safe to take together. Make sure to report any side effects you may have to your health care provider. Relationships Your health care provider may suggest family therapy, couples therapy, or individual therapy as part of your treatment. How to recognize changes Everyone responds differently to treatment for depression. As you recover from depression, you may start to: Have more interest in doing activities. Feel more hopeful. Have more energy. Eat a more regular amount of food. Have better mental focus. It is important to recognize if your depression is not getting better or is getting worse. The symptoms you had in the beginning may return, such as: Feeling tired. Eating too much or too little. Sleeping too much or too little. Feeling restless, agitated, or hopeless. Trouble focusing or making decisions. Having unexplained aches and pains. Feeling irritable, angry, or aggressive. If you or your family members notice these symptoms coming back, let your health care provider know right away. Follow these instructions at home: Activity Try to  get some form of exercise each day, such as walking. Try yoga, mindfulness, or other stress reduction techniques. Participate in group activities if you are able. Lifestyle Get enough sleep. Cut down on or stop using caffeine, tobacco,  alcohol, and any other harmful substances. Eat a healthy diet that includes plenty of vegetables, fruits, whole grains, low-fat dairy products, and lean protein. Limit foods that are high in solid fats, added sugar, or salt (sodium). General instructions Take over-the-counter and prescription medicines only as told by your health care provider. Keep all follow-up visits. It is important for your health care provider to check on your mood, behavior, and medicines. Your health care provider may need to make changes to your treatment. Where to find support Talking to others  Friends and family members can be sources of support and guidance. Talk to trusted friends or family members about your condition. Explain your symptoms and let them know that you are working with a health care provider to treat your depression. Tell friends and family how they can help. Finances Find mental health providers that fit with your financial situation. Talk with your health care provider if you are worried about access to food, housing, or medicine. Call your insurance company to learn about your co-pays and prescription plan. Where to find more information You can find support in your area from: Anxiety and Depression Association of America (ADAA): adaa.org Mental Health America: mentalhealthamerica.net [employer] on Mental Illness: nami.org Contact a health care provider if: You stop taking your antidepressant medicines, and you have any of these symptoms: Nausea. Headache. Light-headedness. Chills and body aches. Not being able to sleep (insomnia). You or your friends and family think your depression is getting worse. Get help right away if: You have thoughts of hurting yourself or others. Get help right away if you feel like you may hurt yourself or others, or have thoughts about taking your own life. Go to your nearest emergency room or: Call 911. Call the National Suicide Prevention Lifeline at  782-585-6076 or 988. This is open 24 hours a day. Text the Crisis Text Line at 202-756-2551. This information is not intended to replace advice given to you by your health care provider. Make sure you discuss any questions you have with your health care provider. Document Revised: 12/10/2021 Document Reviewed: 12/10/2021 Elsevier Patient Education  2024 [employer].

## 2023-09-18 ENCOUNTER — Encounter: Payer: Self-pay | Admitting: Nurse Practitioner

## 2023-09-18 ENCOUNTER — Ambulatory Visit: Payer: No Typology Code available for payment source | Admitting: Nurse Practitioner

## 2023-09-18 VITALS — BP 128/80 | HR 74 | Temp 98.6°F | Wt 217.2 lb

## 2023-09-18 DIAGNOSIS — F419 Anxiety disorder, unspecified: Secondary | ICD-10-CM

## 2023-09-18 DIAGNOSIS — M542 Cervicalgia: Secondary | ICD-10-CM | POA: Diagnosis not present

## 2023-09-18 DIAGNOSIS — F339 Major depressive disorder, recurrent, unspecified: Secondary | ICD-10-CM | POA: Diagnosis not present

## 2023-09-18 MED ORDER — CITALOPRAM HYDROBROMIDE 20 MG PO TABS: 20.0000 mg | ORAL_TABLET | Freq: Every day | ORAL | 2 refills | Status: DC

## 2023-09-18 MED ORDER — PREDNISONE 10 MG (21) PO TBPK: ORAL_TABLET | ORAL | 0 refills | Status: DC

## 2023-09-18 MED ORDER — VITAMIN D3 25 MCG (1000 UT) PO CAPS: 1000.0000 [IU] | ORAL_CAPSULE | Freq: Every day | ORAL | 3 refills | Status: AC

## 2023-09-18 MED ORDER — BUPROPION HCL ER (XL) 300 MG PO TB24: 300.0000 mg | ORAL_TABLET | Freq: Every day | ORAL | 2 refills | Status: DC

## 2023-09-18 MED ORDER — CYCLOBENZAPRINE HCL 10 MG PO TABS: 10.0000 mg | ORAL_TABLET | Freq: Three times a day (TID) | ORAL | 0 refills | Status: DC | PRN

## 2023-09-18 NOTE — Assessment & Plan Note (Signed)
Chronic, stable.  Denies SI/HI.  Continue Celexa and Wellbutrin XL as these are offering benefit.  Educated on side effects and Black Box Warning -- aware to reach out immediately if SI presents or go to nearest ER.  Overall scores much improved at this time.

## 2023-09-18 NOTE — Assessment & Plan Note (Signed)
Chronic with acute flare.  He ordered new pillow to see if this will help.  Discomfort along trapezius both sides.  Will start Prednisone taper and Flexeril as needed (do not take with alcohol or while working or driving).  Use Voltaren gel as needed and provided him information on massage therapist locally.  Continue at home simple treatment.

## 2023-09-18 NOTE — Progress Notes (Signed)
BP 128/80 (BP Location: Right Arm, Patient Position: Sitting)   Pulse 74   Temp 98.6 F (37 C) (Oral)   Wt 217 lb 3.2 oz (98.5 kg)   SpO2 96%   BMI 29.04 kg/m    Subjective:    Patient ID: Jay Hill, male    DOB: 05-07-1986, 38 y.o.   MRN: [id number]  HPI: Jay Hill is a 38 y.o. male  Chief Complaint  Patient presents with   Neck Pain    Come and go been dealing with it for a while been very heavy this week and the job doesn't help   Anxiety   NECK PAIN  Has been present for awhile, always looking down at work.  During day he tries to do stretches to help this. Went to chiropractor for awhile, but did not help 100%.   Treatments attempted:  Biofreeze, chiropractor, rest, ice, heat, APAP, and ibuprofen Relief with NSAIDs?:  moderate Location:midline Duration:chronic with some worsening  Severity: 5/10 today, there are days of 7-8/10 Quality: dull, aching, and throbbing Frequency: constant ache is constant Radiation: headache Aggravating factors: can not think of anything Alleviating factors: as above Weakness:  no Paresthesias / decreased sensation:  no  Fevers:  no   DEPRESSION/ANXIETY Continues on Wellbutrin XL and Celexa.  Does get fatigued after work. Mood status: stable Satisfied with current treatment?: yes Symptom severity: mild  Duration of current treatment : chronic Side effects: no Medication compliance: good compliance Psychotherapy/counseling: yes current Previous psychiatric medications: various meds Depressed mood: occasional Anxious mood: occasional Anhedonia: no Significant weight loss or gain: no Insomnia: occasional Fatigue:  after work on occasion Feelings of worthlessness or guilt: no Impaired concentration/indecisiveness: no Suicidal ideations: no Hopelessness: no Crying spells: no    09/18/2023    3:58 PM 12/15/2022   11:04 AM 10/27/2022    1:55 PM 04/25/2022    3:46 PM 08/28/2021    4:02 PM  Depression screen PHQ 2/9   Decreased Interest 1 2 1 2 2   Down, Depressed, Hopeless 1 2 2 2 1   PHQ - 2 Score 2 4 3 4 3   Altered sleeping 1 3 2 2 1   Tired, decreased energy 1 2 3 3 3   Change in appetite 0 1 0 0 0  Feeling bad or failure about yourself  0 1 0 0 1  Trouble concentrating 1 2 3 2 2   Moving slowly or fidgety/restless 0 0 0 0 0  Suicidal thoughts 0 1 0 0 0  PHQ-9 Score 5 14 11 11 10   Difficult doing work/chores Somewhat difficult Somewhat difficult Very difficult Very difficult Somewhat difficult      09/18/2023    3:59 PM 12/15/2022   11:06 AM 10/27/2022    1:56 PM 04/25/2022    3:47 PM  GAD 7 : Generalized Anxiety Score  Nervous, Anxious, on Edge 1 2 1 1   Control/stop worrying 0 1 1 1   Worry too much - different things 0 1 1 0  Trouble relaxing 0 1 1 1   Restless 0 1 0 0  Easily annoyed or irritable 1 1 1 1   Afraid - awful might happen 0 1 1 0  Total GAD 7 Score 2 8 6 4   Anxiety Difficulty  Not difficult at all Somewhat difficult Somewhat difficult    Relevant past medical, surgical, family and social history reviewed and updated as indicated. Interim medical history since our last visit reviewed. Allergies and medications reviewed and updated.  Review of  Systems  Constitutional:  Negative for activity change, diaphoresis, fatigue and fever.  Respiratory:  Negative for cough, chest tightness, shortness of breath and wheezing.   Cardiovascular:  Negative for chest pain, palpitations and leg swelling.  Neurological: Negative.   Psychiatric/Behavioral:  Negative for decreased concentration, self-injury, sleep disturbance and suicidal ideas. The patient is not nervous/anxious.     Per HPI unless specifically indicated above     Objective:    BP 128/80 (BP Location: Right Arm, Patient Position: Sitting)   Pulse 74   Temp 98.6 F (37 C) (Oral)   Wt 217 lb 3.2 oz (98.5 kg)   SpO2 96%   BMI 29.04 kg/m   Wt Readings from Last 3 Encounters:  09/18/23 217 lb 3.2 oz (98.5 kg)  10/27/22 227  lb 3.2 oz (103.1 kg)  10/21/22 221 lb 3.2 oz (100.3 kg)    Physical Exam Vitals and nursing note reviewed.  Constitutional:      General: He is awake. He is not in acute distress.    Appearance: He is well-developed and well-groomed. He is not ill-appearing or toxic-appearing.  HENT:     Head: Normocephalic.     Right Ear: Hearing and external ear normal.     Left Ear: Hearing and external ear normal.  Eyes:     General: Lids are normal.     Extraocular Movements: Extraocular movements intact.     Conjunctiva/sclera: Conjunctivae normal.  Neck:     Thyroid: No thyromegaly.     Vascular: No carotid bruit.  Cardiovascular:     Rate and Rhythm: Normal rate and regular rhythm.     Heart sounds: Normal heart sounds. No murmur heard.    No gallop.  Pulmonary:     Effort: Pulmonary effort is normal. No accessory muscle usage or respiratory distress.     Breath sounds: Normal breath sounds.  Abdominal:     General: Bowel sounds are normal. There is no distension.     Palpations: Abdomen is soft.     Tenderness: There is no abdominal tenderness.  Musculoskeletal:     Cervical back: Normal range of motion. No rigidity or crepitus. Pain with movement (with flexion and extension) and muscular tenderness (along trapezius) present. No spinous process tenderness. Normal range of motion.     Right lower leg: No edema.     Left lower leg: No edema.  Lymphadenopathy:     Cervical: No cervical adenopathy.  Skin:    General: Skin is warm.     Capillary Refill: Capillary refill takes less than 2 seconds.  Neurological:     Mental Status: He is alert and oriented to person, place, and time.     Deep Tendon Reflexes: Reflexes are normal and symmetric.     Reflex Scores:      Brachioradialis reflexes are 2+ on the right side and 2+ on the left side.      Patellar reflexes are 2+ on the right side and 2+ on the left side. Psychiatric:        Attention and Perception: Attention normal.         Mood and Affect: Mood normal.        Speech: Speech normal.        Behavior: Behavior normal. Behavior is cooperative.        Thought Content: Thought content normal.     Results for orders placed or performed in visit on 01/21/23  VITAMIN D 25 Hydroxy (Vit-D Deficiency, Fractures)  Collection Time: 01/21/23 11:00 AM  Result Value Ref Range   Vit D, 25-Hydroxy 78.5 30.0 - 100.0 ng/mL  Iron   Collection Time: 01/21/23 11:00 AM  Result Value Ref Range   Iron 123 38 - 169 ug/dL      Assessment & Plan:   Problem List Items Addressed This Visit       Other   Anxiety   Refer to depression plan of care.      Relevant Medications   citalopram (CELEXA) 20 MG tablet   buPROPion (WELLBUTRIN XL) 300 MG 24 hr tablet   Depression, recurrent (HCC) - Primary   Chronic, stable.  Denies SI/HI.  Continue Celexa and Wellbutrin XL as these are offering benefit.  Educated on side effects and Black Box Warning -- aware to reach out immediately if SI presents or go to nearest ER.  Overall scores much improved at this time.      Relevant Medications   citalopram (CELEXA) 20 MG tablet   buPROPion (WELLBUTRIN XL) 300 MG 24 hr tablet   Neck pain   Chronic with acute flare.  He ordered new pillow to see if this will help.  Discomfort along trapezius both sides.  Will start Prednisone taper and Flexeril as needed (do not take with alcohol or while working or driving).  Use Voltaren gel as needed and provided him information on massage therapist locally.  Continue at home simple treatment.          Follow up plan: Return in about 6 months (around 03/17/2024) for Depression and Anxiety.

## 2023-09-18 NOTE — Assessment & Plan Note (Signed)
 Refer to depression plan of care.

## 2023-10-11 ENCOUNTER — Other Ambulatory Visit: Payer: Self-pay | Admitting: Nurse Practitioner

## 2023-10-12 MED ORDER — CYCLOBENZAPRINE HCL 10 MG PO TABS: 10.0000 mg | ORAL_TABLET | Freq: Three times a day (TID) | ORAL | 0 refills | Status: DC | PRN

## 2023-10-27 ENCOUNTER — Encounter: Payer: Self-pay | Admitting: Registered Nurse

## 2023-10-27 ENCOUNTER — Telehealth: Payer: Self-pay | Admitting: Registered Nurse

## 2023-10-27 DIAGNOSIS — Z0000 Encounter for general adult medical examination without abnormal findings: Secondary | ICD-10-CM

## 2023-10-27 NOTE — Telephone Encounter (Signed)
 Be Well 2026 appt 10/29/2023 scheduled; orders entered for patient executive panel with Hgba1c  Will need to see RN Olegario Messier next week for results, Ht/Wt/BP check, sign tobacco attestation and Be Well forms.

## 2023-10-29 ENCOUNTER — Other Ambulatory Visit: Payer: Self-pay

## 2023-10-29 DIAGNOSIS — Z0000 Encounter for general adult medical examination without abnormal findings: Secondary | ICD-10-CM

## 2023-10-30 LAB — CMP12+LP+TP+TSH+6AC+CBC/D/PLT
ALT: 25 IU/L (ref 0–44)
AST: 18 IU/L (ref 0–40)
Albumin: 4.7 g/dL (ref 4.1–5.1)
Alkaline Phosphatase: 53 IU/L (ref 44–121)
BUN/Creatinine Ratio: 11 (ref 9–20)
BUN: 14 mg/dL (ref 6–20)
Basophils Absolute: 0.1 10*3/uL (ref 0.0–0.2)
Basos: 1 %
Bilirubin Total: 0.4 mg/dL (ref 0.0–1.2)
Calcium: 9.8 mg/dL (ref 8.7–10.2)
Chloride: 100 mmol/L (ref 96–106)
Chol/HDL Ratio: 4.4 ratio (ref 0.0–5.0)
Cholesterol, Total: 179 mg/dL (ref 100–199)
Creatinine, Ser: 1.26 mg/dL (ref 0.76–1.27)
EOS (ABSOLUTE): 0.3 10*3/uL (ref 0.0–0.4)
Eos: 5 %
Estimated CHD Risk: 0.9 times avg. (ref 0.0–1.0)
Free Thyroxine Index: 1.5 (ref 1.2–4.9)
GGT: 19 IU/L (ref 0–65)
Globulin, Total: 2.4 g/dL (ref 1.5–4.5)
Glucose: 90 mg/dL (ref 70–99)
HDL: 41 mg/dL (ref >39)
Hematocrit: 46.1 % (ref 37.5–51.0)
Hemoglobin: 15.3 g/dL (ref 13.0–17.7)
Immature Grans (Abs): 0.1 10*3/uL (ref 0.0–0.1)
Immature Granulocytes: 1 %
Iron: 109 ug/dL (ref 38–169)
LDH: 177 IU/L (ref 121–224)
LDL Chol Calc (NIH): 119 mg/dL — ABNORMAL HIGH (ref 0–99)
Lymphocytes Absolute: 1.7 10*3/uL (ref 0.7–3.1)
Lymphs: 23 %
MCH: 31.4 pg (ref 26.6–33.0)
MCHC: 33.2 g/dL (ref 31.5–35.7)
MCV: 95 fL (ref 79–97)
Monocytes Absolute: 0.8 10*3/uL (ref 0.1–0.9)
Monocytes: 12 %
Neutrophils Absolute: 4.2 10*3/uL (ref 1.4–7.0)
Neutrophils: 58 %
Phosphorus: 3.8 mg/dL (ref 2.8–4.1)
Platelets: 280 10*3/uL (ref 150–450)
Potassium: 4.8 mmol/L (ref 3.5–5.2)
RBC: 4.87 x10E6/uL (ref 4.14–5.80)
RDW: 12.7 % (ref 11.6–15.4)
Sodium: 138 mmol/L (ref 134–144)
T3 Uptake Ratio: 24 % (ref 24–39)
T4, Total: 6.4 ug/dL (ref 4.5–12.0)
TSH: 0.383 u[IU]/mL — ABNORMAL LOW (ref 0.450–4.500)
Total Protein: 7.1 g/dL (ref 6.0–8.5)
Triglycerides: 105 mg/dL (ref 0–149)
Uric Acid: 6.4 mg/dL (ref 3.8–8.4)
VLDL Cholesterol Cal: 19 mg/dL (ref 5–40)
WBC: 7.1 10*3/uL (ref 3.4–10.8)
eGFR: 74 mL/min/{1.73_m2} (ref >59)

## 2023-10-30 LAB — HGB A1C W/O EAG: Hgb A1c MFr Bld: 5.2 % (ref 4.8–5.6)

## 2023-10-31 ENCOUNTER — Encounter: Payer: Self-pay | Admitting: Registered Nurse

## 2023-11-05 ENCOUNTER — Ambulatory Visit: Payer: Self-pay

## 2023-11-10 NOTE — Telephone Encounter (Signed)
 Patient completed labs LDL 119 Hgba1c 5.2 BP 134/99 weight 212lbs and BMI 27.2  Patient weight last year 221lbs labs stable BP 126/88 met requirements for insurance discount starting 18 May 2024 signed tobacco attestation form and has teledoc account set up.

## 2023-11-27 ENCOUNTER — Encounter: Payer: Self-pay | Admitting: Nurse Practitioner

## 2023-11-27 DIAGNOSIS — M542 Cervicalgia: Secondary | ICD-10-CM

## 2024-03-02 ENCOUNTER — Other Ambulatory Visit: Payer: Self-pay | Admitting: Registered Nurse

## 2024-03-02 ENCOUNTER — Ambulatory Visit: Payer: Self-pay | Admitting: Registered Nurse

## 2024-03-02 DIAGNOSIS — R7989 Other specified abnormal findings of blood chemistry: Secondary | ICD-10-CM

## 2024-03-10 ENCOUNTER — Other Ambulatory Visit: Payer: Self-pay

## 2024-03-10 ENCOUNTER — Other Ambulatory Visit: Payer: Self-pay | Admitting: Registered Nurse

## 2024-03-10 ENCOUNTER — Encounter: Payer: Self-pay | Admitting: Registered Nurse

## 2024-03-10 DIAGNOSIS — R7989 Other specified abnormal findings of blood chemistry: Secondary | ICD-10-CM

## 2024-03-10 NOTE — Progress Notes (Signed)
 38y/o caucasian male established here for venipuncture repeat TSH as low on Be Well earlier this year  Denied symptoms hyperthyroidism weight loss, palpitations (fast/irregular heart beat), trouble sleeping, trouble concentrating, hair loss, sweating, heat intolerance, skin color changes/rashes/itching, dry eyes/changes in vision, shortness of breath with exertion,  Diarrhea, increase or decrease in appetite, loss of menstruation, anxiety, irritability, and/or muscle weakness.  If you experience any of these symptoms please notify clinic staff ASAP.   Venipuncture x 1 right ac cohesive bandage and gauze applied patient notified to remove in  15 minutes.  Dressing clean dry and intact on ambulatory discharge from clinic.  Patient verbalized understanding information/instructions, agreed with plan of care and had no further questions at this time.

## 2024-03-11 ENCOUNTER — Ambulatory Visit: Payer: Self-pay | Admitting: Registered Nurse

## 2024-03-11 LAB — TSH: TSH: 0.764 u[IU]/mL (ref 0.450–4.500)

## 2024-03-18 NOTE — Patient Instructions (Incomplete)
 Managing Depression, Adult Depression is a mental health condition that affects your thoughts, feelings, and actions. Being diagnosed with depression can bring you relief if you did not know why you have felt or behaved a certain way. It could also leave you feeling overwhelmed. Finding ways to manage your symptoms can help you feel more positive about your future. How to manage lifestyle changes Being depressed is difficult. Depression can increase the level of everyday stress. Stress can make depression symptoms worse. You may believe your symptoms cannot be managed or will never improve. However, there are many things you can try to help manage your symptoms. There is hope. Managing stress  Stress is your body's reaction to life changes and events, both good and bad. Stress can add to your feelings of depression. Learning to manage your stress can help lessen your feelings of depression. Try some of the following approaches to reducing your stress (stress reduction techniques): Listen to music that you enjoy and that inspires you. Try using a meditation app or take a meditation class. Develop a practice that helps you connect with your spiritual self. Walk in nature, pray, or go to a place of worship. Practice deep breathing. To do this, inhale slowly through your nose. Pause at the top of your inhale for a few seconds and then exhale slowly, letting yourself relax. Repeat this three or four times. Practice yoga to help relax and work your muscles. Choose a stress reduction technique that works for you. These techniques take time and practice to develop. Set aside 5-15 minutes a day to do them. Therapists can offer training in these techniques. Do these things to help manage stress: Keep a journal. Know your limits. Set healthy boundaries for yourself and others, such as saying "no" when you think something is too much. Pay attention to how you react to certain situations. You may not be able to  control everything, but you can change your reaction. Add humor to your life by watching funny movies or shows. Make time for activities that you enjoy and that relax you. Spend less time using electronics, especially at night before bed. The light from screens can make your brain think it is time to get up rather than go to bed.  Medicines Medicines, such as antidepressants, are often a part of treatment for depression. Talk with your pharmacist or health care provider about all the medicines, supplements, and herbal products that you take, their possible side effects, and what medicines and other products are safe to take together. Make sure to report any side effects you may have to your health care provider. Relationships Your health care provider may suggest family therapy, couples therapy, or individual therapy as part of your treatment. How to recognize changes Everyone responds differently to treatment for depression. As you recover from depression, you may start to: Have more interest in doing activities. Feel more hopeful. Have more energy. Eat a more regular amount of food. Have better mental focus. It is important to recognize if your depression is not getting better or is getting worse. The symptoms you had in the beginning may return, such as: Feeling tired. Eating too much or too little. Sleeping too much or too little. Feeling restless, agitated, or hopeless. Trouble focusing or making decisions. Having unexplained aches and pains. Feeling irritable, angry, or aggressive. If you or your family members notice these symptoms coming back, let your health care provider know right away. Follow these instructions at home: Activity Try to  get some form of exercise each day, such as walking. Try yoga, mindfulness, or other stress reduction techniques. Participate in group activities if you are able. Lifestyle Get enough sleep. Cut down on or stop using caffeine, tobacco,  alcohol, and any other harmful substances. Eat a healthy diet that includes plenty of vegetables, fruits, whole grains, low-fat dairy products, and lean protein. Limit foods that are high in solid fats, added sugar, or salt (sodium). General instructions Take over-the-counter and prescription medicines only as told by your health care provider. Keep all follow-up visits. It is important for your health care provider to check on your mood, behavior, and medicines. Your health care provider may need to make changes to your treatment. Where to find support Talking to others  Friends and family members can be sources of support and guidance. Talk to trusted friends or family members about your condition. Explain your symptoms and let them know that you are working with a health care provider to treat your depression. Tell friends and family how they can help. Finances Find mental health providers that fit with your financial situation. Talk with your health care provider if you are worried about access to food, housing, or medicine. Call your insurance company to learn about your co-pays and prescription plan. Where to find more information You can find support in your area from: Anxiety and Depression Association of America (ADAA): adaa.org Mental Health America: mentalhealthamerica.net [employer] on Mental Illness: nami.org Contact a health care provider if: You stop taking your antidepressant medicines, and you have any of these symptoms: Nausea. Headache. Light-headedness. Chills and body aches. Not being able to sleep (insomnia). You or your friends and family think your depression is getting worse. Get help right away if: You have thoughts of hurting yourself or others. Get help right away if you feel like you may hurt yourself or others, or have thoughts about taking your own life. Go to your nearest emergency room or: Call 911. Call the National Suicide Prevention Lifeline at  360-042-0264 or 988. This is open 24 hours a day. Text the Crisis Text Line at 952-302-5572. This information is not intended to replace advice given to you by your health care provider. Make sure you discuss any questions you have with your health care provider. Document Revised: 12/10/2021 Document Reviewed: 12/10/2021 Elsevier Patient Education  2024 [employer].

## 2024-03-22 ENCOUNTER — Ambulatory Visit: Payer: Self-pay | Admitting: Nurse Practitioner

## 2024-06-12 ENCOUNTER — Other Ambulatory Visit: Payer: Self-pay | Admitting: Nurse Practitioner

## 2024-06-14 NOTE — Telephone Encounter (Signed)
 Courtesy refill. Patient will need an office visit for additional refills.  Requested Prescriptions  Pending Prescriptions Disp Refills   buPROPion  (WELLBUTRIN  XL) 300 MG 24 hr tablet [Pharmacy Med Name: BUPROPION  HCL XL 300 MG TABLET] 30 tablet 0    Sig: TAKE 1 TABLET BY MOUTH EVERY DAY     Psychiatry: Antidepressants - bupropion  Failed - 06/14/2024  9:50 AM      Failed - Last BP in normal range    BP Readings from Last 1 Encounters:  11/05/23 (!) 134/99         Failed - Valid encounter within last 6 months    Recent Outpatient Visits   None            Passed - Cr in normal range and within 360 days    Creatinine, Ser  Date Value Ref Range Status  10/29/2023 1.26 0.76 - 1.27 mg/dL Final         Passed - AST in normal range and within 360 days    AST  Date Value Ref Range Status  10/29/2023 18 0 - 40 IU/L Final         Passed - ALT in normal range and within 360 days    ALT  Date Value Ref Range Status  10/29/2023 25 0 - 44 IU/L Final         Passed - Completed PHQ-2 or PHQ-9 in the last 360 days

## 2024-06-23 ENCOUNTER — Other Ambulatory Visit: Payer: Self-pay | Admitting: Registered Nurse

## 2024-06-23 ENCOUNTER — Ambulatory Visit: Payer: Self-pay | Admitting: General Practice

## 2024-06-23 DIAGNOSIS — Z23 Encounter for immunization: Secondary | ICD-10-CM

## 2024-07-18 ENCOUNTER — Other Ambulatory Visit: Payer: Self-pay | Admitting: Nurse Practitioner

## 2024-07-21 NOTE — Telephone Encounter (Signed)
 Requested medication (s) are due for refill today: yes  Requested medication (s) are on the active medication list: yes  Last refill:  06/14/24  Future visit scheduled: no  Notes to clinic:  Unable to refill per protocol, courtesy refill already given, routing for provider approval.      Requested Prescriptions  Pending Prescriptions Disp Refills   buPROPion  (WELLBUTRIN  XL) 300 MG 24 hr tablet [Pharmacy Med Name: BUPROPION  HCL XL 300 MG TABLET] 90 tablet 1    Sig: TAKE 1 TABLET BY MOUTH EVERY DAY     Psychiatry: Antidepressants - bupropion  Failed - 07/21/2024 11:28 AM      Failed - Last BP in normal range    BP Readings from Last 1 Encounters:  11/05/23 (!) 134/99         Failed - Valid encounter within last 6 months    Recent Outpatient Visits   None            Passed - Cr in normal range and within 360 days    Creatinine, Ser  Date Value Ref Range Status  10/29/2023 1.26 0.76 - 1.27 mg/dL Final         Passed - AST in normal range and within 360 days    AST  Date Value Ref Range Status  10/29/2023 18 0 - 40 IU/L Final         Passed - ALT in normal range and within 360 days    ALT  Date Value Ref Range Status  10/29/2023 25 0 - 44 IU/L Final         Passed - Completed PHQ-2 or PHQ-9 in the last 360 days

## 2024-09-06 ENCOUNTER — Encounter: Payer: Self-pay | Admitting: Registered Nurse

## 2024-09-18 ENCOUNTER — Other Ambulatory Visit: Payer: Self-pay | Admitting: Nurse Practitioner

## 2024-09-20 NOTE — Telephone Encounter (Signed)
 Courtesy refill given, appointment needed.   Requested Prescriptions  Pending Prescriptions Disp Refills   citalopram  (CELEXA ) 20 MG tablet [Pharmacy Med Name: CITALOPRAM  HBR 20 MG TABLET] 30 tablet 0    Sig: TAKE 1 TABLET BY MOUTH EVERY DAY     Psychiatry:  Antidepressants - SSRI Failed - 09/20/2024  9:17 AM      Failed - Completed PHQ-2 or PHQ-9 in the last 360 days      Failed - Valid encounter within last 6 months    Recent Outpatient Visits   None
